# Patient Record
Sex: Female | Born: 1990 | Race: Black or African American | Hispanic: No | Marital: Single | Smoking: Never smoker
Health system: Southern US, Community
[De-identification: ages and names within clinical notes are randomized; demographics above are authoritative.]

## PROBLEM LIST (undated history)

## (undated) ENCOUNTER — Inpatient Hospital Stay (HOSPITAL_COMMUNITY): Payer: Self-pay

## (undated) DIAGNOSIS — E119 Type 2 diabetes mellitus without complications: Secondary | ICD-10-CM

## (undated) DIAGNOSIS — Z789 Other specified health status: Secondary | ICD-10-CM

## (undated) HISTORY — PX: NO PAST SURGERIES: SHX2092

## (undated) HISTORY — PX: DILATION AND CURETTAGE OF UTERUS: SHX78

---
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
## 2014-01-17 ENCOUNTER — Ambulatory Visit: Payer: Self-pay | Admitting: Family Medicine

## 2015-02-06 ENCOUNTER — Encounter (HOSPITAL_COMMUNITY): Payer: Self-pay | Admitting: Emergency Medicine

## 2015-02-06 ENCOUNTER — Emergency Department (HOSPITAL_COMMUNITY)
Admission: EM | Admit: 2015-02-06 | Discharge: 2015-02-06 | Discharge status: Against Medical Advice | Payer: Self-pay | Attending: Emergency Medicine | Admitting: Emergency Medicine

## 2015-02-06 DIAGNOSIS — Z3201 Encounter for pregnancy test, result positive: Secondary | ICD-10-CM | POA: Insufficient documentation

## 2015-02-06 LAB — I-STAT BETA HCG BLOOD, ED (MC, WL, AP ONLY): HCG, QUANTITATIVE: 261.4 m[IU]/mL — AB (ref <5)

## 2015-02-06 NOTE — ED Notes (Signed)
Pt from home states her last menstrual cycle was 01/10/15- 01/12/15. Pt states she took two home pregnancy tests and both came up positive. She has had light spotting today and cramps. She wants to verify that she is pregnant.

## 2015-02-06 NOTE — ED Notes (Signed)
Pt did not answer when called from lobby.  

## 2015-02-07 NOTE — ED Notes (Signed)
Pt did not answer when called from lobby.  

## 2015-04-05 ENCOUNTER — Emergency Department (HOSPITAL_COMMUNITY): Payer: Medicaid Other

## 2015-04-05 ENCOUNTER — Encounter (HOSPITAL_COMMUNITY): Payer: Self-pay | Admitting: *Deleted

## 2015-04-05 ENCOUNTER — Emergency Department (HOSPITAL_COMMUNITY)
Admission: EM | Admit: 2015-04-05 | Discharge: 2015-04-05 | Disposition: A | Discharge status: Home / Self Care | Payer: Medicaid Other | Attending: Emergency Medicine | Admitting: Emergency Medicine

## 2015-04-05 DIAGNOSIS — O4690 Antepartum hemorrhage, unspecified, unspecified trimester: Secondary | ICD-10-CM

## 2015-04-05 DIAGNOSIS — O418X20 Other specified disorders of amniotic fluid and membranes, second trimester, not applicable or unspecified: Secondary | ICD-10-CM

## 2015-04-05 DIAGNOSIS — Z79899 Other long term (current) drug therapy: Secondary | ICD-10-CM | POA: Diagnosis not present

## 2015-04-05 DIAGNOSIS — Z3A13 13 weeks gestation of pregnancy: Secondary | ICD-10-CM | POA: Insufficient documentation

## 2015-04-05 DIAGNOSIS — O209 Hemorrhage in early pregnancy, unspecified: Secondary | ICD-10-CM | POA: Diagnosis present

## 2015-04-05 DIAGNOSIS — O468X2 Other antepartum hemorrhage, second trimester: Secondary | ICD-10-CM

## 2015-04-05 LAB — BASIC METABOLIC PANEL
ANION GAP: 8 (ref 5–15)
BUN: 6 mg/dL (ref 6–20)
CHLORIDE: 107 mmol/L (ref 101–111)
CO2: 22 mmol/L (ref 22–32)
CREATININE: 0.68 mg/dL (ref 0.44–1.00)
Calcium: 9.2 mg/dL (ref 8.9–10.3)
GFR calc non Af Amer: >60 mL/min (ref >60)
Glucose, Bld: 90 mg/dL (ref 65–99)
Potassium: 3.9 mmol/L (ref 3.5–5.1)
SODIUM: 137 mmol/L (ref 135–145)

## 2015-04-05 LAB — URINALYSIS, ROUTINE W REFLEX MICROSCOPIC
Bilirubin Urine: NEGATIVE
Glucose, UA: NEGATIVE mg/dL
Ketones, ur: NEGATIVE mg/dL
NITRITE: NEGATIVE
Protein, ur: NEGATIVE mg/dL
Specific Gravity, Urine: 1.026 (ref 1.005–1.030)
UROBILINOGEN UA: 1 mg/dL (ref 0.0–1.0)
pH: 7 (ref 5.0–8.0)

## 2015-04-05 LAB — CBC WITH DIFFERENTIAL/PLATELET
BASOS ABS: 0 10*3/uL (ref 0.0–0.1)
BASOS PCT: 0 % (ref 0–1)
EOS ABS: 0.1 10*3/uL (ref 0.0–0.7)
Eosinophils Relative: 1 % (ref 0–5)
HCT: 39 % (ref 36.0–46.0)
HEMOGLOBIN: 12.8 g/dL (ref 12.0–15.0)
Lymphocytes Relative: 36 % (ref 12–46)
Lymphs Abs: 2.1 10*3/uL (ref 0.7–4.0)
MCH: 26.1 pg (ref 26.0–34.0)
MCHC: 32.8 g/dL (ref 30.0–36.0)
MCV: 79.6 fL (ref 78.0–100.0)
Monocytes Absolute: 0.8 10*3/uL (ref 0.1–1.0)
Monocytes Relative: 14 % — ABNORMAL HIGH (ref 3–12)
Neutro Abs: 2.9 10*3/uL (ref 1.7–7.7)
Neutrophils Relative %: 49 % (ref 43–77)
Platelets: 148 10*3/uL — ABNORMAL LOW (ref 150–400)
RBC: 4.9 MIL/uL (ref 3.87–5.11)
RDW: 13.9 % (ref 11.5–15.5)
WBC: 5.9 10*3/uL (ref 4.0–10.5)

## 2015-04-05 LAB — ABO/RH: ABO/RH(D): B POS

## 2015-04-05 LAB — URINE MICROSCOPIC-ADD ON

## 2015-04-05 NOTE — Discharge Instructions (Signed)
As discussed you need to not have sex and you need to call your doctor to be seen earlier then Friday. If you start to bleed more then one pad per hour you need to be re seen at [facility] Vaginal Bleeding During Pregnancy, Second Trimester  A small amount of bleeding (spotting) from the vagina is common in pregnancy. Sometimes the bleeding is normal and is not a problem, and sometimes it is a sign of something serious. Be sure to tell your doctor about any bleeding from your vagina right away. HOME CARE  Watch your condition for any changes.  Follow your doctor's instructions about how active you can be.  If you are on bed rest:  You may need to stay in bed and only get up to use the bathroom.  You may be allowed to do some activities.  If you need help, make plans for someone to help you.  Write down:  The number of pads you use each day.  How often you change pads.  How soaked (saturated) your pads are.  Do not use tampons.  Do not douche.  Do not have sex or orgasms until your doctor says it is okay.  If you pass any tissue from your vagina, save the tissue so you can show it to your doctor.  Only take medicines as told by your doctor.  Do not take aspirin because it can make you bleed.  Do not exercise, lift heavy weights, or do any activities that take a lot of energy and effort unless your doctor says it is okay.  Keep all follow-up visits as told by your doctor. GET HELP IF:   You bleed from your vagina.  You have cramps.  You have labor pains.  You have a fever that does not go away after you take medicine. GET HELP RIGHT AWAY IF:  You have very bad cramps in your back or belly (abdomen).  You have contractions.  You have chills.  You pass large clots or tissue from your vagina.  You bleed more.  You feel light-headed or weak.  You pass out (faint).  You are leaking fluid or have a gush of fluid from your vagina. MAKE SURE YOU:  Understand  these instructions.  Will watch your condition.  Will get help right away if you are not doing well or get worse. Document Released: 12/16/2013 Document Reviewed: 04/08/2013 [facility] Patient Information 2015 [place], [place]. This information is not intended to replace advice given to you by your health care provider. Make sure you discuss any questions you have with your health care provider.  Subchorionic Hematoma A subchorionic hematoma is a gathering of blood between the outer wall of the placenta and the inner wall of the womb (uterus). The placenta is the organ that connects the fetus to the wall of the uterus. The placenta performs the feeding, breathing (oxygen to the fetus), and waste removal (excretory work) of the fetus.  Subchorionic hematoma is the most common abnormality found on a result from ultrasonography done during the first trimester or early second trimester of pregnancy. If there has been little or no vaginal bleeding, early small hematomas usually shrink on their own and do not affect your baby or pregnancy. The blood is gradually absorbed over 1-2 weeks. When bleeding starts later in pregnancy or the hematoma is larger or occurs in an older pregnant woman, the outcome may not be as good. Larger hematomas may get bigger, which increases the chances for miscarriage.  Subchorionic hematoma also increases the risk of premature detachment of the placenta from the uterus, preterm (premature) labor, and stillbirth. HOME CARE INSTRUCTIONS  Stay on bed rest if your health care provider recommends this. Although bed rest will not prevent more bleeding or prevent a miscarriage, your health care provider may recommend bed rest until you are advised otherwise.  Avoid heavy lifting (more than 10 lb [4.5 kg]), exercise, sexual intercourse, or douching as directed by your health care provider.  Keep track of the number of pads you use each day and how soaked (saturated) they are. Write down  this information.  Do not use tampons.  Keep all follow-up appointments as directed by your health care provider. Your health care provider may ask you to have follow-up blood tests or ultrasound tests or both. SEEK IMMEDIATE MEDICAL CARE IF:  You have severe cramps in your stomach, back, abdomen, or pelvis.  You have a fever.  You pass large clots or tissue. Save any tissue for your health care provider to look at.  Your bleeding increases or you become lightheaded, feel weak, or have fainting episodes. Document Released: 11/16/2006 Document Revised: 12/16/2013 Document Reviewed: 02/28/2013 [facility] Patient Information 2015 [place], [place]. This information is not intended to replace advice given to you by your health care provider. Make sure you discuss any questions you have with your health care provider.

## 2015-04-05 NOTE — ED Provider Notes (Signed)
CSN: [id number]     Arrival date & time 04/05/15  1742 History   First MD Initiated Contact with Patient 04/05/15 1821     Chief Complaint  Patient presents with  . Vaginal Bleeding     (Consider location/radiation/quality/duration/timing/severity/associated sxs/prior Treatment) HPI Comments: Pt comes in with c/o vaginal bleeding for about 1 hour. Pt states that she has also had onset of lower abdominal pain. She states that she is [redacted] weeks pregnant and she has had an ultrasound by her ob to show that baby is intrauterine. She has had one other pregnancy without complication. She has not had any dysuria, vomiting,fever or diarrhea.  The history is provided by the patient. No language interpreter was used.    History reviewed. No pertinent past medical history. History reviewed. No pertinent past surgical history. No family history on file. Social History  Substance Use Topics  . Smoking status: Never Smoker   . Smokeless tobacco: None  . Alcohol Use: Yes     Comment: socially   OB History    Gravida Para Term Preterm AB TAB SAB Ectopic Multiple Living            1     Review of Systems  All other systems reviewed and are negative.     Allergies  Review of patient's allergies indicates no known allergies.  Home Medications   Prior to Admission medications   Medication Sig Start Date End Date Taking? Authorizing Provider  Prenatal Vit-Fe Fumarate-FA (PRENATAL PO) Take 1 tablet by mouth daily.   Yes Historical Provider, MD   BP 118/62 mmHg  Pulse 98  Temp(Src) 98.5 F (36.9 C) (Oral)  Resp 16  SpO2 99%  LMP 01/08/2015 Physical Exam  Constitutional: She is oriented to person, place, and time. She appears well-developed and well-nourished.  HENT:  Head: Normocephalic and atraumatic.  Cardiovascular: Normal rate and regular rhythm.   Pulmonary/Chest: Effort normal and breath sounds normal.  Abdominal: Soft. Bowel sounds are normal.  Generalized lower abdominal  tenderness  Genitourinary:  Small amount of blood in the vaginal vault  Musculoskeletal: Normal range of motion.  Neurological: She is alert and oriented to person, place, and time.  Skin: Skin is warm and dry.  Psychiatric: She has a normal mood and affect.  Nursing note and vitals reviewed.   ED Course  Procedures (including critical care time) Labs Review Labs Reviewed  CBC WITH DIFFERENTIAL/PLATELET - Abnormal; Notable for the following:    Platelets 148 (*)    Monocytes Relative 14 (*)    All other components within normal limits  URINALYSIS, ROUTINE W REFLEX MICROSCOPIC (NOT AT [facility]) - Abnormal; Notable for the following:    APPearance CLOUDY (*)    Hgb urine dipstick LARGE (*)    Leukocytes, UA MODERATE (*)    All other components within normal limits  URINE MICROSCOPIC-ADD ON - Abnormal; Notable for the following:    Squamous Epithelial / LPF MANY (*)    Bacteria, UA MANY (*)    All other components within normal limits  URINE CULTURE  BASIC METABOLIC PANEL  ABO/RH    Imaging Review US Ob Comp Less 14 Wks  04/05/2015   CLINICAL DATA:  Vaginal bleeding. Estimated gestational age by LMP is 12 weeks 3 days. Quantitative beta HCG is not provided.  EXAM: OBSTETRIC <14 WK ULTRASOUND  TECHNIQUE: Transabdominal ultrasound was performed for evaluation of the gestation as well as the maternal uterus and adnexal regions.  COMPARISON:  02/24/2015  FINDINGS: Intrauterine gestational sac: A single intrauterine pregnancy is identified.  Yolk sac: Yolk sac is not identified consistent with gestational age.  Embryo:  Fetal pole is present.  Cardiac Activity: Fetal cardiac activity is observed.  Heart Rate: 150 bpm  Limited measurements obtained.  Biparietal diameter: 20.5 mm 13 w 2 d [place] EDC: 10/09/2015  Measurements are consistent with previous ultrasound and LMP dates.  Maternal uterus/adnexae: No myometrial mass lesions. Placenta is anterior and is not grossly low-lying. Small subchorionic  hemorrhage is visualized superiorly. Both ovaries are visualized and are unremarkable. No abnormal adnexal masses. No free pelvic fluid.  IMPRESSION: Single intrauterine pregnancy. Estimated gestational age by limited measurement of biparietal diameter is 13 weeks 2 days. Size is consistent with previous study and LMP. Small subchorionic hemorrhage.   Electronically Signed   By: Burman Nieves M.D.   On: 04/05/2015 21:17   I have personally reviewed and evaluated these images and lab results as part of my medical decision-making.   EKG Interpretation None      MDM   Final diagnoses:  Subchorionic hemorrhage, second trimester    Discussed findings with pt. She [place] an ob [place] that she can follow up with. She is comfortable at this time;pt given return precautions    Teressa Lower, NP 04/05/15 2222  Benjiman Core, MD 04/06/15 [phone]

## 2015-04-05 NOTE — ED Notes (Signed)
Patient unable to urinate at this time. 

## 2015-04-05 NOTE — ED Notes (Signed)
Pt reports hematuria x 30 minutes ago.  Pt is [redacted] weeks pregnant.  Pt also reports low abd cramping as well.

## 2015-04-06 ENCOUNTER — Encounter (HOSPITAL_COMMUNITY): Payer: Self-pay | Admitting: *Deleted

## 2015-04-06 ENCOUNTER — Inpatient Hospital Stay (HOSPITAL_COMMUNITY)
Admission: AD | Admit: 2015-04-06 | Discharge: 2015-04-06 | Disposition: A | Discharge status: Home / Self Care | Payer: Medicaid Other | Source: Ambulatory Visit | Attending: Family Medicine | Admitting: Family Medicine

## 2015-04-06 DIAGNOSIS — N939 Abnormal uterine and vaginal bleeding, unspecified: Secondary | ICD-10-CM | POA: Insufficient documentation

## 2015-04-06 DIAGNOSIS — O209 Hemorrhage in early pregnancy, unspecified: Secondary | ICD-10-CM | POA: Diagnosis present

## 2015-04-06 DIAGNOSIS — O418X10 Other specified disorders of amniotic fluid and membranes, first trimester, not applicable or unspecified: Secondary | ICD-10-CM

## 2015-04-06 DIAGNOSIS — O4691 Antepartum hemorrhage, unspecified, first trimester: Secondary | ICD-10-CM | POA: Diagnosis not present

## 2015-04-06 DIAGNOSIS — Z3A12 12 weeks gestation of pregnancy: Secondary | ICD-10-CM | POA: Diagnosis not present

## 2015-04-06 DIAGNOSIS — O468X1 Other antepartum hemorrhage, first trimester: Secondary | ICD-10-CM

## 2015-04-06 HISTORY — DX: Other specified health status: Z78.9

## 2015-04-06 LAB — URINALYSIS, ROUTINE W REFLEX MICROSCOPIC
BILIRUBIN URINE: NEGATIVE
Glucose, UA: NEGATIVE mg/dL
Ketones, ur: 15 mg/dL — AB
NITRITE: NEGATIVE
Protein, ur: NEGATIVE mg/dL
SPECIFIC GRAVITY, URINE: 1.02 (ref 1.005–1.030)
UROBILINOGEN UA: 1 mg/dL (ref 0.0–1.0)
pH: 7 (ref 5.0–8.0)

## 2015-04-06 LAB — URINE MICROSCOPIC-ADD ON

## 2015-04-06 NOTE — MAU Note (Signed)
Pt. States she had lower abdominal pain yesterday at the pool with her son. Then noticed bright red bleeding and small clots. Went to [facility] and they did an ultrasound and told her she had a subchorionic hemorrhage. Pt. States bleeding stopped and this am walked her son to the bus and felt that she might have peed her pants but once she got home she saw that it was a large amount of dark blood. Here for evaluation and per pt. A better explaination about what is happening. FHR 156.

## 2015-04-06 NOTE — Discharge Instructions (Signed)
Subchorionic Hematoma °A subchorionic hematoma is a gathering of blood between the outer wall of the placenta and the inner wall of the womb (uterus). The placenta is the organ that connects the fetus to the wall of the uterus. The placenta performs the feeding, breathing (oxygen to the fetus), and waste removal (excretory work) of the fetus.  °Subchorionic hematoma is the most common abnormality found on a result from ultrasonography done during the first trimester or early second trimester of pregnancy. If there has been little or no vaginal bleeding, early small hematomas usually shrink on their own and do not affect your baby or pregnancy. The blood is gradually absorbed over 1-2 weeks. When bleeding starts later in pregnancy or the hematoma is larger or occurs in an older pregnant woman, the outcome may not be as good. Larger hematomas may get bigger, which increases the chances for miscarriage. Subchorionic hematoma also increases the risk of premature detachment of the placenta from the uterus, preterm (premature) labor, and stillbirth. °HOME CARE INSTRUCTIONS °· Stay on bed rest if your health care provider recommends this. Although bed rest will not prevent more bleeding or prevent a miscarriage, your health care provider may recommend bed rest until you are advised otherwise. °· Avoid heavy lifting (more than 10 lb [4.5 kg]), exercise, sexual intercourse, or douching as directed by your health care provider. °· Keep track of the number of pads you use each day and how soaked (saturated) they are. Write down this information. °· Do not use tampons. °· Keep all follow-up appointments as directed by your health care provider. Your health care provider may ask you to have follow-up blood tests or ultrasound tests or both. °SEEK IMMEDIATE MEDICAL CARE IF: °· You have severe cramps in your stomach, back, abdomen, or pelvis. °· You have a fever. °· You pass large clots or tissue. Save any tissue for your health  care provider to look at. °· Your bleeding increases or you become lightheaded, feel weak, or have fainting episodes. °Document Released: 11/16/2006 Document Revised: 12/16/2013 Document Reviewed: 02/28/2013 °ExitCare® Patient Information ©2015 [employer]. This information is not intended to replace advice given to you by your health care provider. Make sure you discuss any questions you have with your health care provider. ° °Pelvic Rest °Pelvic rest is sometimes recommended for women when:  °· The placenta is partially or completely covering the opening of the cervix (placenta previa). °· There is bleeding between the uterine wall and the amniotic sac in the first trimester (subchorionic hemorrhage). °· The cervix begins to open without labor starting (incompetent cervix, cervical insufficiency). °· The labor is too early (preterm labor). °HOME CARE INSTRUCTIONS °· Do not have sexual intercourse, stimulation, or an orgasm. °· Do not use tampons, douche, or put anything in the vagina. °· Do not lift anything over 10 pounds (4.5 kg). °· Avoid strenuous activity or straining your pelvic muscles. °SEEK MEDICAL CARE IF:  °· You have any vaginal bleeding during pregnancy. Treat this as a potential emergency. °· You have cramping pain felt low in the stomach (stronger than menstrual cramps). °· You notice vaginal discharge (watery, mucus, or bloody). °· You have a low, dull backache. °· There are regular contractions or uterine tightening. °SEEK IMMEDIATE MEDICAL CARE IF: °You have vaginal bleeding and have placenta previa.  °Document Released: 11/26/2010 Document Revised: 10/24/2011 Document Reviewed: 11/26/2010 °ExitCare® Patient Information ©2015 [employer]. This information is not intended to replace advice given to you by your health care provider.   Make sure you discuss any questions you have with your health care provider.     Prenatal Care [facility]    [facility]  [facility]  [facility]  [phone]     Phone: [phone]          [facility]                      [facility]   [place]     Phone: [phone]  Phone: [phone]         [facility]     Phone: [phone]  Phone: [phone]           [facility] [place]                hone: [phone]  Phone: [phone]         [facility] [address] Francoise Ceo      Phone: [phone]  Phone: [phone]         [facility] Dept.                Phone: [phone]  [facility]   [address] [place])          Phone: [phone] [facility]   Phone: [phone]

## 2015-04-06 NOTE — Plan of Care (Signed)
Pt. Urine in lab 

## 2015-04-06 NOTE — MAU Provider Note (Signed)
History     CSN: [id number]  Arrival date and time: 04/06/15 1101   First Provider Initiated Contact with Patient 04/06/15 1217      No chief complaint on file.  HPI   Ms.Olivia Mccall is a 24 y.o. female G2P0 at [redacted]w[redacted]d presenting with vaginal bleeding; patient has a known small subchorionic hemorrhage that was diagnosed at [facility] yesterday.  She walked her son to the bus stop this morning and had another episode of bleeding. The bleeding is the same as it was yesterday; dark red in color, brown at times. She has occasional abdominal cramping; she has not taken anything for the symptoms.   OB History    Gravida Para Term Preterm AB TAB SAB Ectopic Multiple Living   2         1      Past Medical History  Diagnosis Date  . Medical history non-contributory     Past Surgical History  Procedure Laterality Date  . No past surgeries      History reviewed. No pertinent family history.  Social History  Substance Use Topics  . Smoking status: Never Smoker   . Smokeless tobacco: None  . Alcohol Use: No     Comment: socially    Allergies: No Known Allergies  Prescriptions prior to admission  Medication Sig Dispense Refill Last Dose  . Prenatal Vit-Fe Fumarate-FA (PRENATAL PO) Take 1 tablet by mouth daily.   04/06/2015 at Unknown time   Results for orders placed or performed during the hospital encounter of 04/06/15 (from the past 48 hour(s))  Urinalysis, Routine w reflex microscopic (not at [facility])     Status: Abnormal   Collection Time: 04/06/15 11:40 AM  Result Value Ref Range   Color, Urine YELLOW YELLOW   APPearance HAZY (A) CLEAR   Specific Gravity, Urine 1.020 1.005 - 1.030   pH 7.0 5.0 - 8.0   Glucose, UA NEGATIVE NEGATIVE mg/dL   Hgb urine dipstick LARGE (A) NEGATIVE   Bilirubin Urine NEGATIVE NEGATIVE   Ketones, ur 15 (A) NEGATIVE mg/dL   Protein, ur NEGATIVE NEGATIVE mg/dL   Urobilinogen, UA 1.0 0.0 - 1.0 mg/dL   Nitrite NEGATIVE NEGATIVE   Leukocytes, UA SMALL (A) NEGATIVE  Urine microscopic-add on     Status: Abnormal   Collection Time: 04/06/15 11:40 AM  Result Value Ref Range   Squamous Epithelial / LPF MANY (A) RARE   WBC, UA 3-6 <3 WBC/hpf   Bacteria, UA FEW (A) RARE   Urine-Other MUCOUS PRESENT      Review of Systems  Constitutional: Negative for fever and chills.  Gastrointestinal: Positive for nausea and abdominal pain (occasional, bilateral lower abdominal cramping ). Negative for diarrhea and constipation.  Genitourinary: Negative for dysuria, urgency, frequency and hematuria.   Physical Exam   Blood pressure 123/53, pulse 97, temperature 98.3 F (36.8 C), temperature source Oral, resp. rate 18, last menstrual period 01/08/2015.  Physical Exam  Constitutional: She is oriented to person, place, and time. She appears well-developed and well-nourished. No distress.  HENT:  Head: Normocephalic.  Eyes: Pupils are equal, round, and reactive to light.  Neck: Neck supple.  Cardiovascular: Normal rate.   Respiratory: Effort normal.  GI: Soft.  Genitourinary:  Speculum exam: Vagina - Small amount of creamy, brown discharge, no odor Cervix - No contact bleeding, no active bleeding  Bimanual exam: Cervix closed Chaperone present for exam.  Musculoskeletal: Normal range of motion.  Neurological: She is alert and oriented  to person, place, and time.  Skin: Skin is warm. She is not diaphoretic.  Psychiatric: Her behavior is normal.    MAU Course  Procedures  None  MDM   + fetal heart tones via doppler.   Assessment and Plan   A:  1. Vaginal bleeding in pregnancy, first trimester   2. Subchorionic hematoma in first trimester     P:  Discharge home in stable condition Increase PO fluid intake Return to MAU if symptoms worsen Bleeding precautions   Duane Lope, NP 04/06/2015 8:07 PM

## 2015-04-08 LAB — URINE CULTURE

## 2016-02-09 ENCOUNTER — Encounter (HOSPITAL_COMMUNITY): Payer: Self-pay | Admitting: *Deleted

## 2018-09-27 ENCOUNTER — Encounter (HOSPITAL_BASED_OUTPATIENT_CLINIC_OR_DEPARTMENT_OTHER): Payer: Self-pay | Admitting: *Deleted

## 2018-09-27 ENCOUNTER — Emergency Department (HOSPITAL_BASED_OUTPATIENT_CLINIC_OR_DEPARTMENT_OTHER): Payer: 59

## 2018-09-27 ENCOUNTER — Other Ambulatory Visit: Payer: Self-pay

## 2018-09-27 ENCOUNTER — Inpatient Hospital Stay (HOSPITAL_BASED_OUTPATIENT_CLINIC_OR_DEPARTMENT_OTHER)
Admission: EM | Admit: 2018-09-27 | Discharge: 2018-10-02 | DRG: 193 | Disposition: A | Discharge status: Home / Self Care | Payer: 59 | Attending: Internal Medicine | Admitting: Internal Medicine

## 2018-09-27 DIAGNOSIS — T886XXA Anaphylactic reaction due to adverse effect of correct drug or medicament properly administered, initial encounter: Secondary | ICD-10-CM | POA: Diagnosis not present

## 2018-09-27 DIAGNOSIS — X58XXXA Exposure to other specified factors, initial encounter: Secondary | ICD-10-CM | POA: Diagnosis present

## 2018-09-27 DIAGNOSIS — J180 Bronchopneumonia, unspecified organism: Secondary | ICD-10-CM | POA: Diagnosis not present

## 2018-09-27 DIAGNOSIS — J986 Disorders of diaphragm: Secondary | ICD-10-CM

## 2018-09-27 DIAGNOSIS — J9811 Atelectasis: Secondary | ICD-10-CM | POA: Diagnosis present

## 2018-09-27 DIAGNOSIS — R0600 Dyspnea, unspecified: Secondary | ICD-10-CM

## 2018-09-27 DIAGNOSIS — J45909 Unspecified asthma, uncomplicated: Secondary | ICD-10-CM | POA: Diagnosis not present

## 2018-09-27 DIAGNOSIS — G92 Toxic encephalopathy: Secondary | ICD-10-CM | POA: Diagnosis present

## 2018-09-27 DIAGNOSIS — Z8709 Personal history of other diseases of the respiratory system: Secondary | ICD-10-CM | POA: Insufficient documentation

## 2018-09-27 DIAGNOSIS — R0682 Tachypnea, not elsewhere classified: Secondary | ICD-10-CM | POA: Diagnosis present

## 2018-09-27 DIAGNOSIS — J383 Other diseases of vocal cords: Secondary | ICD-10-CM | POA: Diagnosis present

## 2018-09-27 DIAGNOSIS — R161 Splenomegaly, not elsewhere classified: Secondary | ICD-10-CM | POA: Diagnosis not present

## 2018-09-27 DIAGNOSIS — J9601 Acute respiratory failure with hypoxia: Secondary | ICD-10-CM | POA: Diagnosis present

## 2018-09-27 DIAGNOSIS — R946 Abnormal results of thyroid function studies: Secondary | ICD-10-CM | POA: Diagnosis not present

## 2018-09-27 DIAGNOSIS — R000 Tachycardia, unspecified: Secondary | ICD-10-CM | POA: Diagnosis not present

## 2018-09-27 DIAGNOSIS — T368X5A Adverse effect of other systemic antibiotics, initial encounter: Secondary | ICD-10-CM | POA: Diagnosis not present

## 2018-09-27 DIAGNOSIS — R059 Cough, unspecified: Secondary | ICD-10-CM

## 2018-09-27 DIAGNOSIS — Z6841 Body Mass Index (BMI) 40.0 and over, adult: Secondary | ICD-10-CM

## 2018-09-27 DIAGNOSIS — R05 Cough: Secondary | ICD-10-CM

## 2018-09-27 DIAGNOSIS — R0789 Other chest pain: Secondary | ICD-10-CM

## 2018-09-27 LAB — CBC WITH DIFFERENTIAL/PLATELET
Abs Immature Granulocytes: 0.01 10*3/uL (ref 0.00–0.07)
Basophils Absolute: 0.1 10*3/uL (ref 0.0–0.1)
Basophils Relative: 1 %
Eosinophils Absolute: 0 10*3/uL (ref 0.0–0.5)
Eosinophils Relative: 0 %
HCT: 42.6 % (ref 36.0–46.0)
Hemoglobin: 12.9 g/dL (ref 12.0–15.0)
IMMATURE GRANULOCYTES: 0 %
Lymphocytes Relative: 67 %
Lymphs Abs: 4.8 10*3/uL — ABNORMAL HIGH (ref 0.7–4.0)
MCH: 24.1 pg — AB (ref 26.0–34.0)
MCHC: 30.3 g/dL (ref 30.0–36.0)
MCV: 79.6 fL — ABNORMAL LOW (ref 80.0–100.0)
Monocytes Absolute: 0.5 10*3/uL (ref 0.1–1.0)
Monocytes Relative: 7 %
NEUTROS ABS: 1.8 10*3/uL (ref 1.7–7.7)
NEUTROS PCT: 25 %
PLATELETS: 166 10*3/uL (ref 150–400)
RBC: 5.35 MIL/uL — ABNORMAL HIGH (ref 3.87–5.11)
RDW: 14.8 % (ref 11.5–15.5)
WBC MORPHOLOGY: ABNORMAL
WBC: 7.2 10*3/uL (ref 4.0–10.5)
nRBC: 0 % (ref 0.0–0.2)

## 2018-09-27 LAB — RESPIRATORY PANEL BY PCR
Adenovirus: NOT DETECTED
Bordetella pertussis: NOT DETECTED
CHLAMYDOPHILA PNEUMONIAE-RVPPCR: NOT DETECTED
Coronavirus 229E: NOT DETECTED
Coronavirus HKU1: NOT DETECTED
Coronavirus NL63: NOT DETECTED
Coronavirus OC43: NOT DETECTED
INFLUENZA B-RVPPCR: NOT DETECTED
Influenza A: NOT DETECTED
Metapneumovirus: NOT DETECTED
Mycoplasma pneumoniae: NOT DETECTED
Parainfluenza Virus 1: NOT DETECTED
Parainfluenza Virus 2: NOT DETECTED
Parainfluenza Virus 3: NOT DETECTED
Parainfluenza Virus 4: NOT DETECTED
Respiratory Syncytial Virus: NOT DETECTED
Rhinovirus / Enterovirus: NOT DETECTED

## 2018-09-27 LAB — INFLUENZA PANEL BY PCR (TYPE A & B)
INFLAPCR: NEGATIVE
Influenza B By PCR: NEGATIVE

## 2018-09-27 LAB — BASIC METABOLIC PANEL
ANION GAP: 7 (ref 5–15)
BUN: 7 mg/dL (ref 6–20)
CALCIUM: 9.1 mg/dL (ref 8.9–10.3)
CO2: 26 mmol/L (ref 22–32)
Chloride: 102 mmol/L (ref 98–111)
Creatinine, Ser: 1.02 mg/dL — ABNORMAL HIGH (ref 0.44–1.00)
GFR calc Af Amer: >60 mL/min (ref >60)
GLUCOSE: 122 mg/dL — AB (ref 70–99)
Potassium: 4.1 mmol/L (ref 3.5–5.1)
Sodium: 135 mmol/L (ref 135–145)

## 2018-09-27 MED ORDER — SODIUM CHLORIDE 0.9 % IV BOLUS
1000.0000 mL | Freq: Once | INTRAVENOUS | Status: AC
  Administered 2018-09-27: 1000 mL via INTRAVENOUS

## 2018-09-27 MED ORDER — IOPAMIDOL (ISOVUE-370) INJECTION 76%
100.0000 mL | Freq: Once | INTRAVENOUS | Status: AC | PRN
  Administered 2018-09-27: 100 mL via INTRAVENOUS

## 2018-09-27 MED ORDER — ACETAMINOPHEN 500 MG PO TABS
1000.0000 mg | ORAL_TABLET | Freq: Once | ORAL | Status: AC
  Administered 2018-09-27: 1000 mg via ORAL
  Filled 2018-09-27: qty 2

## 2018-09-27 MED ORDER — ALBUTEROL SULFATE (2.5 MG/3ML) 0.083% IN NEBU
2.5000 mg | INHALATION_SOLUTION | Freq: Once | RESPIRATORY_TRACT | Status: AC
  Administered 2018-09-27: 2.5 mg via RESPIRATORY_TRACT
  Filled 2018-09-27: qty 3

## 2018-09-27 MED ORDER — IPRATROPIUM-ALBUTEROL 0.5-2.5 (3) MG/3ML IN SOLN
3.0000 mL | Freq: Four times a day (QID) | RESPIRATORY_TRACT | Status: DC
  Administered 2018-09-27: 3 mL via RESPIRATORY_TRACT
  Filled 2018-09-27: qty 3

## 2018-09-27 MED ORDER — SODIUM CHLORIDE 0.9 % IV SOLN: Freq: Once | INTRAVENOUS | Status: DC

## 2018-09-27 NOTE — ED Notes (Signed)
Nurse first-pt NAD-nonprod cough-O2 sat 97%- HR 118- RR 20- NAD

## 2018-09-27 NOTE — ED Notes (Signed)
Attempted report to floor.  

## 2018-09-27 NOTE — ED Triage Notes (Signed)
Pt c/o Uri symptoms x 3 days

## 2018-09-27 NOTE — ED Notes (Signed)
[facility] notified (Jaime) - patient ready for transport 

## 2018-09-27 NOTE — ED Notes (Signed)
Pt transferred to [place] via [facility] 

## 2018-09-27 NOTE — ED Notes (Signed)
Oxygen saturation 97-99 while ambulating.  HR 140s

## 2018-09-27 NOTE — ED Provider Notes (Signed)
[facility] EMERGENCY DEPARTMENT Provider Note   CSN: [id number] Arrival date & time: 09/27/18  1339     History   Chief Complaint Chief Complaint  Patient presents with  . URI    HPI Olivia Mccall is a 28 y.o. female presenting for evaluation of cough, chest pain, shortness of breath.  Patient states for the past 2 days, she has been having a dry, constant cough.  Today, she developed generalized chest pain.  She states she feels short of breath, especially when she walks.  She denies fevers, chills, nasal congestion, ear pain, sore throat, nausea, vomiting, dental pain, urinary symptoms, normal bowel movements.  She denies leg pain or swelling.  She denies recent travel, surgeries, immobilization, history of cancer, or history of previous DVT/PE.  She is on OCPs.  She denies history of heart problems or family history of heart problems.  She has tobacco, alcohol, or drug use.  She has no medical problems, takes medications daily.  She is not been taking anything for her symptoms.  She denies history of asthma or COPD.  She denies sick contacts.  HPI  Past Medical History:  Diagnosis Date  . Medical history non-contributory     Patient Active Problem List   Diagnosis Date Noted  . Acute respiratory failure with hypoxia (HCC) 09/27/2018    Past Surgical History:  Procedure Laterality Date  . NO PAST SURGERIES       OB History    Gravida  2   Para      Term      Preterm      AB      Living  1     SAB      TAB      Ectopic      Multiple      Live Births               Home Medications    Prior to Admission medications   Not on File    Family History History reviewed. No pertinent family history.  Social History Social History   Tobacco Use  . Smoking status: Never Smoker  . Smokeless tobacco: Never Used  Substance Use Topics  . Alcohol use: No    Comment: socially  . Drug use: No     Allergies   Patient has no known  allergies.   Review of Systems Review of Systems  Respiratory: Positive for cough and shortness of breath.   Cardiovascular: Positive for chest pain.  All other systems reviewed and are negative.    Physical Exam Updated Vital Signs BP 121/85 (BP Location: Left Arm)   Pulse (!) 115   Temp 98.1 F (36.7 C) (Oral)   Resp (!) 28   Ht 5\' 2"  (1.575 m)   Wt 107.6 kg   LMP 07/31/2018   SpO2 97%   BMI 43.39 kg/m   Physical Exam Vitals signs and nursing note reviewed.  Constitutional:      General: She is not in acute distress.    Appearance: She is well-developed.  HENT:     Head: Normocephalic and atraumatic.     Comments: OP clear without tonsillar swelling or exudate.  Uvula midline with equal palate rise.  TMs nonerythematous and nonbulging bilaterally. Eyes:     Conjunctiva/sclera: Conjunctivae normal.     Pupils: Pupils are equal, round, and reactive to light.  Neck:     Musculoskeletal: Normal range of motion and neck supple.  Cardiovascular:     Rate and Rhythm: Regular rhythm. Tachycardia present.     Comments: Tachycardic around 125 Pulmonary:     Effort: Pulmonary effort is normal. Tachypnea present. No respiratory distress.     Breath sounds: Normal breath sounds. No wheezing.     Comments: Patient tachypneic around 25-30 on my exam.  Speaking in full sentences.  Clear lung sounds in all fields.  Dry cough noted on exam. Abdominal:     General: There is no distension.     Palpations: Abdomen is soft. There is no mass.     Tenderness: There is no abdominal tenderness. There is no guarding or rebound.  Musculoskeletal: Normal range of motion.  Skin:    General: Skin is warm and dry.     Capillary Refill: Capillary refill takes less than 2 seconds.  Neurological:     Mental Status: She is alert and oriented to person, place, and time.      ED Treatments / Results  Labs (all labs ordered are listed, but only abnormal results are displayed) Labs Reviewed    CBC WITH DIFFERENTIAL/PLATELET - Abnormal; Notable for the following components:      Result Value   RBC 5.35 (*)    MCV 79.6 (*)    MCH 24.1 (*)    Lymphs Abs 4.8 (*)    All other components within normal limits  BASIC METABOLIC PANEL - Abnormal; Notable for the following components:   Glucose, Bld 122 (*)    Creatinine, Ser 1.02 (*)    All other components within normal limits  RESPIRATORY PANEL BY PCR  INFLUENZA PANEL BY PCR (TYPE A & B)  PATHOLOGIST SMEAR REVIEW    EKG EKG Interpretation  Date/Time:  Thursday September 27 2018 18:41:30 EST Ventricular Rate:  117 PR Interval:    QRS Duration: 79 QT Interval:  307 QTC Calculation: 429 R Axis:   64 Text Interpretation:  Sinus tachycardia Probable left atrial enlargement RSR' in V1 or V2, right VCD or RVH Borderline T abnormalities, inferior leads No STEMI.  Confirmed by Alona Bene [phone]) on 09/27/2018 6:47:05 PM   Radiology Ct Angio Chest Pe W/cm &/or Wo Cm  Result Date: 09/27/2018 CLINICAL DATA:  Three day history of cough, shortness of breath and chest pain. EXAM: CT ANGIOGRAPHY CHEST WITH CONTRAST TECHNIQUE: Multidetector CT imaging of the chest was performed using the standard protocol during bolus administration of intravenous contrast. Multiplanar CT image reconstructions and MIPs were obtained to evaluate the vascular anatomy. CONTRAST:  ISOVUE-370 IOPAMIDOL (ISOVUE-370) INJECTION 76% COMPARISON:  None. FINDINGS: Cardiovascular: The heart is normal in size. No pericardial effusion. Small amount of fluid in the pericardial recesses. The aorta is normal in caliber. No dissection. No atherosclerotic calcifications. The branch vessels are patent. The pulmonary arterial tree is fairly well opacified. No filling defects to suggest pulmonary embolism. Mediastinum/Nodes: No mediastinal or hilar mass or lymphadenopathy. Small scattered lymph nodes are noted. The esophagus is grossly normal. Lungs/Pleura: Moderate eventration  of the right hemidiaphragm with overlying vascular crowding and streaky atelectasis. No infiltrates, edema or effusions. There are few scattered small subpleural nodules which are likely lymph nodes. No worrisome pulmonary lesions. Upper Abdomen: No significant upper abdominal findings. The spleen is only partially visualized but measures 13 x 9.5 cm. Could not exclude mild splenomegaly. Musculoskeletal: No breast masses, supraclavicular or axillary lymphadenopathy. Small scattered lymph nodes are noted. The thyroid gland is grossly normal. No significant bony findings. Review of the MIP images  confirms the above findings. IMPRESSION: 1. No CT findings for pulmonary embolism. 2. Normal thoracic aorta. 3. No acute pulmonary findings. 4. Eventration of the right hemidiaphragm with overlying vascular crowding and streaky atelectasis. 5. Possible splenomegaly. Limited ultrasound examination may be helpful for more accurate measurement. Electronically Signed   By: Rudie MeyerP.  Gallerani M.D.   On: 09/27/2018 17:09    Procedures Procedures (including critical care time)  Medications Ordered in ED Medications  ipratropium-albuterol (DUONEB) 0.5-2.5 (3) MG/3ML nebulizer solution 3 mL (3 mLs Nebulization Not Given 09/27/18 2000)  0.9 %  sodium chloride infusion (has no administration in time range)  iopamidol (ISOVUE-370) 76 % injection 100 mL (100 mLs Intravenous Contrast Given 09/27/18 1646)  sodium chloride 0.9 % bolus 1,000 mL (0 mLs Intravenous Stopped 09/27/18 2019)  acetaminophen (TYLENOL) tablet 1,000 mg (1,000 mg Oral Given 09/27/18 1834)  albuterol (PROVENTIL) (2.5 MG/3ML) 0.083% nebulizer solution 2.5 mg (2.5 mg Nebulization Given 09/27/18 2024)  sodium chloride 0.9 % bolus 1,000 mL (1,000 mLs Intravenous Transfusing/Transfer 09/27/18 2158)     Initial Impression / Assessment and Plan / ED Course  I have reviewed the triage vital signs and the nursing notes.  Pertinent labs & imaging results that were  available during my care of the patient were reviewed by me and considered in my medical decision making (see chart for details).     Pt presenting for evaluation of chest pain, cough, shortness of breath.  Physical exam concerning, patient is very tachycardic and tachypneic.  Additionally, is having cough, but no other URI symptoms.  Chest pain and shortness of breath began today.  Concern for possible PE, patient is on OCPs and obese.  Consider pneumonia or other lung infection.  Consider flu, although less likely due to no fever or other URI symptoms.  Will trial breathing treatment and reassess. Doubt cardiac cause or ACS at this time.   Labs without leukocytosis or electrolyte abnormality.  However, patient with abnormal lymphocytes, and pathology smear started.  On reassessment, patient remains tachycardic and tachypneic.  No change in sxs with breathing treatment.  PE scan pending.  PE scan negative for infection or PE. Shows mild atelectasis.  Continues to be tachycardic and tachypneic up to the 40s.  Fluid bolus started, Tylenol given for possible low-grade fever.  Patient with continued tachycardia and tachypnea, on ambulation sats remained stable but heart rate increased to 140.  Patient was visibly short of breath with ambulation.  Case discussed with attending, Dr. Jacqulyn BathLong evaluated the patient.  Consider possible early pneumonitis due to a viral illness.  Patient now with nasal congestion low-grade fever of 100.2.  However, this is out of proportion with her exam and the amount of tachypnea and tachycardia she has.  As such, will call for admission.  Discussed with Dr. Toniann FailKakrakandy from tried hospital service, patient to be admitted.  Patient's blood pressure recorded to be low with a automatic blood pressure cuff at 80/60.  Manual blood pressure improved at 110/84, however side ultrasound performed.  Ultrasound showed good motility, movement, and no pleural effusion.  [facility]   Final Clinical Impressions(s) / ED Diagnoses   Final diagnoses:  Tachycardia  Tachypnea  Cough  Atypical chest pain    ED Discharge Orders    None       Alveria ApleyCaccavale, Marylen Zuk, PA-C 09/28/18 0009    Maia PlanLong, Joshua G, MD 09/28/18 1029

## 2018-09-28 ENCOUNTER — Encounter (HOSPITAL_COMMUNITY): Payer: Self-pay | Admitting: Internal Medicine

## 2018-09-28 ENCOUNTER — Observation Stay (HOSPITAL_COMMUNITY): Payer: 59

## 2018-09-28 ENCOUNTER — Inpatient Hospital Stay (HOSPITAL_COMMUNITY): Payer: 59

## 2018-09-28 DIAGNOSIS — R0682 Tachypnea, not elsewhere classified: Secondary | ICD-10-CM | POA: Diagnosis present

## 2018-09-28 DIAGNOSIS — J9811 Atelectasis: Secondary | ICD-10-CM | POA: Diagnosis present

## 2018-09-28 DIAGNOSIS — R0609 Other forms of dyspnea: Secondary | ICD-10-CM

## 2018-09-28 DIAGNOSIS — R0600 Dyspnea, unspecified: Secondary | ICD-10-CM | POA: Diagnosis not present

## 2018-09-28 DIAGNOSIS — R161 Splenomegaly, not elsewhere classified: Secondary | ICD-10-CM | POA: Diagnosis present

## 2018-09-28 DIAGNOSIS — R000 Tachycardia, unspecified: Secondary | ICD-10-CM | POA: Diagnosis present

## 2018-09-28 DIAGNOSIS — G92 Toxic encephalopathy: Secondary | ICD-10-CM | POA: Diagnosis present

## 2018-09-28 DIAGNOSIS — J383 Other diseases of vocal cords: Secondary | ICD-10-CM | POA: Diagnosis present

## 2018-09-28 DIAGNOSIS — T368X5A Adverse effect of other systemic antibiotics, initial encounter: Secondary | ICD-10-CM | POA: Diagnosis not present

## 2018-09-28 DIAGNOSIS — J45909 Unspecified asthma, uncomplicated: Secondary | ICD-10-CM | POA: Diagnosis present

## 2018-09-28 DIAGNOSIS — R946 Abnormal results of thyroid function studies: Secondary | ICD-10-CM | POA: Diagnosis present

## 2018-09-28 DIAGNOSIS — T886XXA Anaphylactic reaction due to adverse effect of correct drug or medicament properly administered, initial encounter: Secondary | ICD-10-CM | POA: Diagnosis not present

## 2018-09-28 DIAGNOSIS — Z6841 Body Mass Index (BMI) 40.0 and over, adult: Secondary | ICD-10-CM | POA: Diagnosis not present

## 2018-09-28 DIAGNOSIS — J180 Bronchopneumonia, unspecified organism: Secondary | ICD-10-CM | POA: Diagnosis present

## 2018-09-28 DIAGNOSIS — X58XXXA Exposure to other specified factors, initial encounter: Secondary | ICD-10-CM | POA: Diagnosis present

## 2018-09-28 DIAGNOSIS — G9340 Encephalopathy, unspecified: Secondary | ICD-10-CM | POA: Diagnosis not present

## 2018-09-28 DIAGNOSIS — J9601 Acute respiratory failure with hypoxia: Secondary | ICD-10-CM | POA: Diagnosis not present

## 2018-09-28 LAB — HEPATIC FUNCTION PANEL
ALT: 43 U/L (ref 0–44)
AST: 42 U/L — AB (ref 15–41)
Albumin: 2.7 g/dL — ABNORMAL LOW (ref 3.5–5.0)
Alkaline Phosphatase: 41 U/L (ref 38–126)
Bilirubin, Direct: 0.3 mg/dL — ABNORMAL HIGH (ref 0.0–0.2)
Indirect Bilirubin: 0.2 mg/dL — ABNORMAL LOW (ref 0.3–0.9)
Total Bilirubin: 0.5 mg/dL (ref 0.3–1.2)
Total Protein: 6.8 g/dL (ref 6.5–8.1)

## 2018-09-28 LAB — RAPID URINE DRUG SCREEN, HOSP PERFORMED
AMPHETAMINES: NOT DETECTED
BARBITURATES: NOT DETECTED
Benzodiazepines: NOT DETECTED
Cocaine: NOT DETECTED
Opiates: NOT DETECTED
Tetrahydrocannabinol: NOT DETECTED

## 2018-09-28 LAB — CBC
HCT: 36.3 % (ref 36.0–46.0)
Hemoglobin: 11.6 g/dL — ABNORMAL LOW (ref 12.0–15.0)
MCH: 24.7 pg — ABNORMAL LOW (ref 26.0–34.0)
MCHC: 32 g/dL (ref 30.0–36.0)
MCV: 77.2 fL — ABNORMAL LOW (ref 80.0–100.0)
Platelets: 149 10*3/uL — ABNORMAL LOW (ref 150–400)
RBC: 4.7 MIL/uL (ref 3.87–5.11)
RDW: 14.8 % (ref 11.5–15.5)
WBC: 6.1 10*3/uL (ref 4.0–10.5)
nRBC: 0 % (ref 0.0–0.2)

## 2018-09-28 LAB — BASIC METABOLIC PANEL
Anion gap: 6 (ref 5–15)
BUN: <5 mg/dL — ABNORMAL LOW (ref 6–20)
CO2: 22 mmol/L (ref 22–32)
Calcium: 8.1 mg/dL — ABNORMAL LOW (ref 8.9–10.3)
Chloride: 109 mmol/L (ref 98–111)
Creatinine, Ser: 0.9 mg/dL (ref 0.44–1.00)
GFR calc non Af Amer: >60 mL/min (ref >60)
Glucose, Bld: 98 mg/dL (ref 70–99)
Potassium: 3.8 mmol/L (ref 3.5–5.1)
Sodium: 137 mmol/L (ref 135–145)

## 2018-09-28 LAB — T4, FREE: Free T4: 0.87 ng/dL (ref 0.82–1.77)

## 2018-09-28 LAB — ECHOCARDIOGRAM COMPLETE
HEIGHTINCHES: 62 in
Weight: 3795.44 oz

## 2018-09-28 LAB — HIV ANTIBODY (ROUTINE TESTING W REFLEX): HIV Screen 4th Generation wRfx: NONREACTIVE

## 2018-09-28 LAB — LIPASE, BLOOD: Lipase: 24 U/L (ref 11–51)

## 2018-09-28 LAB — BRAIN NATRIURETIC PEPTIDE: B Natriuretic Peptide: 4.6 pg/mL (ref 0.0–100.0)

## 2018-09-28 LAB — SEDIMENTATION RATE: Sed Rate: 21 mm/hr (ref 0–22)

## 2018-09-28 LAB — C-REACTIVE PROTEIN: CRP: 4.7 mg/dL — ABNORMAL HIGH (ref <1.0)

## 2018-09-28 LAB — TROPONIN I
Troponin I: <0.03 ng/mL (ref <0.03)
Troponin I: <0.03 ng/mL (ref <0.03)
Troponin I: <0.03 ng/mL (ref <0.03)

## 2018-09-28 LAB — PATHOLOGIST SMEAR REVIEW: Path Review: REACTIVE

## 2018-09-28 LAB — CK: Total CK: 90 U/L (ref 38–234)

## 2018-09-28 LAB — PREGNANCY, URINE: Preg Test, Ur: NEGATIVE

## 2018-09-28 LAB — TSH: TSH: 5.662 u[IU]/mL — ABNORMAL HIGH (ref 0.350–4.500)

## 2018-09-28 MED ORDER — IPRATROPIUM BROMIDE 0.02 % IN SOLN
0.5000 mg | Freq: Four times a day (QID) | RESPIRATORY_TRACT | Status: DC
  Administered 2018-09-28 – 2018-09-29 (×5): 0.5 mg via RESPIRATORY_TRACT
  Filled 2018-09-28 (×4): qty 2.5

## 2018-09-28 MED ORDER — DEXTROMETHORPHAN POLISTIREX ER 30 MG/5ML PO SUER
15.0000 mg | Freq: Two times a day (BID) | ORAL | Status: DC | PRN
  Filled 2018-09-28: qty 5

## 2018-09-28 MED ORDER — GUAIFENESIN ER 600 MG PO TB12
600.0000 mg | ORAL_TABLET | Freq: Two times a day (BID) | ORAL | Status: DC
  Administered 2018-09-28 – 2018-10-02 (×8): 600 mg via ORAL
  Filled 2018-09-28 (×8): qty 1

## 2018-09-28 MED ORDER — LEVALBUTEROL HCL 0.63 MG/3ML IN NEBU
0.6300 mg | INHALATION_SOLUTION | Freq: Four times a day (QID) | RESPIRATORY_TRACT | Status: DC | PRN
  Administered 2018-09-28: 0.63 mg via RESPIRATORY_TRACT
  Filled 2018-09-28: qty 3

## 2018-09-28 MED ORDER — SODIUM CHLORIDE 0.9 % IV BOLUS
500.0000 mL | Freq: Once | INTRAVENOUS | Status: AC
  Administered 2018-09-28: 500 mL via INTRAVENOUS

## 2018-09-28 MED ORDER — AZITHROMYCIN 250 MG PO TABS
500.0000 mg | ORAL_TABLET | Freq: Every day | ORAL | Status: DC
  Administered 2018-09-29 – 2018-10-02 (×4): 500 mg via ORAL
  Filled 2018-09-28 (×4): qty 2

## 2018-09-28 MED ORDER — ONDANSETRON HCL 4 MG/2ML IJ SOLN: 4.0000 mg | Freq: Four times a day (QID) | INTRAMUSCULAR | Status: DC | PRN

## 2018-09-28 MED ORDER — SODIUM CHLORIDE 0.9 % IV SOLN
1.0000 g | INTRAVENOUS | Status: DC
  Administered 2018-09-28 – 2018-09-29 (×2): 1 g via INTRAVENOUS
  Filled 2018-09-28 (×2): qty 10

## 2018-09-28 MED ORDER — LEVALBUTEROL HCL 0.63 MG/3ML IN NEBU
0.6300 mg | INHALATION_SOLUTION | Freq: Four times a day (QID) | RESPIRATORY_TRACT | Status: DC
  Administered 2018-09-28 – 2018-09-29 (×5): 0.63 mg via RESPIRATORY_TRACT
  Filled 2018-09-28 (×5): qty 3

## 2018-09-28 MED ORDER — ACETAMINOPHEN 325 MG PO TABS
650.0000 mg | ORAL_TABLET | Freq: Four times a day (QID) | ORAL | Status: DC | PRN
  Administered 2018-09-28 – 2018-09-29 (×3): 650 mg via ORAL
  Filled 2018-09-28 (×3): qty 2

## 2018-09-28 MED ORDER — ACETAMINOPHEN 650 MG RE SUPP: 650.0000 mg | Freq: Four times a day (QID) | RECTAL | Status: DC | PRN

## 2018-09-28 MED ORDER — SODIUM CHLORIDE 0.9 % IV SOLN
INTRAVENOUS | Status: DC
  Administered 2018-09-29 – 2018-09-30 (×2): 1000 mL via INTRAVENOUS

## 2018-09-28 MED ORDER — TRAMADOL HCL 50 MG PO TABS
50.0000 mg | ORAL_TABLET | Freq: Once | ORAL | Status: AC
  Administered 2018-09-28: 50 mg via ORAL
  Filled 2018-09-28: qty 1

## 2018-09-28 MED ORDER — ONDANSETRON HCL 4 MG PO TABS: 4.0000 mg | ORAL_TABLET | Freq: Four times a day (QID) | ORAL | Status: DC | PRN

## 2018-09-28 NOTE — Progress Notes (Signed)
Pt has refused any further attempts at ABG draw at this time.

## 2018-09-28 NOTE — Progress Notes (Signed)
RT attempted ABG x1 RR and x1 LR with only a flash of blood obtained. RT will attempt to find another RT to obtain.

## 2018-09-28 NOTE — H&P (Addendum)
History and Physical    Olivia Mccall AOZ:[id number] DOB: April 04, 1991 DOA: 09/27/2018  PCP: Patient, No Pcp Per  Patient coming from: Home.  Chief Complaint: Shortness of breath.  HPI: Olivia Mccall is a 28 y.o. female with no significant past medical history presents to the ER admits in [facility] with complaint of shortness of breath.  Patient has been having shortness of breath the last 3 days increased on exertion.  Prior to which patient had some epigastric discomfort last week which lasted for 3 days.  Presently patient has no pain.  Denies any fever chills nausea vomiting diarrhea or any recent travel or sick contacts.  ED Course: In the ER patient was tachypneic and tachycardic.  CT angiogram of the chest was negative for anything acute.  EKG was showing sinus tachycardia.  Patient's heart rate increased up to 140 improved with fluids.  Initially was given nebulizer treatment for possible wheezing.  Respiratory viral panel was sent and influenza has come back as negative rest of which are pending.  Patient admitted for acute respiratory failure with hypoxia cause not clear.  Review of Systems: As per HPI, rest all negative.   Past Medical History:  Diagnosis Date  . Medical history non-contributory     Past Surgical History:  Procedure Laterality Date  . NO PAST SURGERIES       reports that she has never smoked. She has never used smokeless tobacco. She reports that she does not drink alcohol or use drugs.  No Known Allergies  Family History  Problem Relation Age of Onset  . Diabetes Mellitus II Maternal Grandmother     Prior to Admission medications   Not on File    Physical Exam: Vitals:   09/27/18 2130 09/27/18 2132 09/27/18 2201 09/27/18 2308  BP: 110/84 110/84 126/74 121/85  Pulse: (!) 115  (!) 114 (!) 115  Resp: (!) 29  (!) 31 (!) 28  Temp: 98.6 F (37 C)   98.1 F (36.7 C)  TempSrc: Oral   Oral  SpO2: 98%  100% 97%  Weight:    107.6 kg    Height:    5\' 2"  (1.575 m)      Constitutional: Moderately built and nourished. Vitals:   09/27/18 2130 09/27/18 2132 09/27/18 2201 09/27/18 2308  BP: 110/84 110/84 126/74 121/85  Pulse: (!) 115  (!) 114 (!) 115  Resp: (!) 29  (!) 31 (!) 28  Temp: 98.6 F (37 C)   98.1 F (36.7 C)  TempSrc: Oral   Oral  SpO2: 98%  100% 97%  Weight:    107.6 kg  Height:    5\' 2"  (1.575 m)   Eyes: Anicteric no pallor. ENMT: No discharge from the ears eyes nose and mouth. Neck: No mass felt.  No neck rigidity. Respiratory: No rhonchi or crepitations. Cardiovascular: S1-S2 heard. Abdomen: Soft nontender bowel sounds present. Musculoskeletal: No edema.  No joint effusion. Skin: No rash. Neurologic: Alert awake oriented to time place and person.  Moves all extremities. Psychiatric: Appears normal.  Normal affect.   Labs on Admission: I have personally reviewed following labs and imaging studies  CBC: Recent Labs  Lab 09/27/18 1611  WBC 7.2  NEUTROABS 1.8  HGB 12.9  HCT 42.6  MCV 79.6*  PLT 166   Basic Metabolic Panel: Recent Labs  Lab 09/27/18 1611  NA 135  K 4.1  CL 102  CO2 26  GLUCOSE 122*  BUN 7  CREATININE 1.02*  CALCIUM 9.1   GFR: Estimated Creatinine Clearance: 95.6 mL/min (A) (by C-G formula based on SCr of 1.02 mg/dL (H)). Liver Function Tests: No results for input(s): AST, ALT, ALKPHOS, BILITOT, PROT, ALBUMIN in the last 168 hours. No results for input(s): LIPASE, AMYLASE in the last 168 hours. No results for input(s): AMMONIA in the last 168 hours. Coagulation Profile: No results for input(s): INR, PROTIME in the last 168 hours. Cardiac Enzymes: No results for input(s): CKTOTAL, CKMB, CKMBINDEX, TROPONINI in the last 168 hours. BNP (last 3 results) No results for input(s): PROBNP in the last 8760 hours. HbA1C: No results for input(s): HGBA1C in the last 72 hours. CBG: No results for input(s): GLUCAP in the last 168 hours. Lipid Profile: No results for  input(s): CHOL, HDL, LDLCALC, TRIG, CHOLHDL, LDLDIRECT in the last 72 hours. Thyroid Function Tests: No results for input(s): TSH, T4TOTAL, FREET4, T3FREE, THYROIDAB in the last 72 hours. Anemia Panel: No results for input(s): VITAMINB12, FOLATE, FERRITIN, TIBC, IRON, RETICCTPCT in the last 72 hours. Urine analysis:    Component Value Date/Time   COLORURINE YELLOW 04/06/2015 1140   APPEARANCEUR HAZY (A) 04/06/2015 1140   LABSPEC 1.020 04/06/2015 1140   PHURINE 7.0 04/06/2015 1140   GLUCOSEU NEGATIVE 04/06/2015 1140   HGBUR LARGE (A) 04/06/2015 1140   BILIRUBINUR NEGATIVE 04/06/2015 1140   KETONESUR 15 (A) 04/06/2015 1140   PROTEINUR NEGATIVE 04/06/2015 1140   UROBILINOGEN 1.0 04/06/2015 1140   NITRITE NEGATIVE 04/06/2015 1140   LEUKOCYTESUR SMALL (A) 04/06/2015 1140   Sepsis Labs: @LABRCNTIP (procalcitonin:4,lacticidven:4) ) Recent Results (from the past 240 hour(s))  Respiratory Panel by PCR     Status: None   Collection Time: 09/27/18  8:03 PM  Result Value Ref Range Status   Adenovirus NOT DETECTED NOT DETECTED Final   Coronavirus 229E NOT DETECTED NOT DETECTED Final    Comment: (NOTE) The Coronavirus on the Respiratory Panel, DOES NOT test for the novel  Coronavirus (2019 nCoV)    Coronavirus HKU1 NOT DETECTED NOT DETECTED Final   Coronavirus NL63 NOT DETECTED NOT DETECTED Final   Coronavirus OC43 NOT DETECTED NOT DETECTED Final   Metapneumovirus NOT DETECTED NOT DETECTED Final   Rhinovirus / Enterovirus NOT DETECTED NOT DETECTED Final   Influenza A NOT DETECTED NOT DETECTED Final   Influenza B NOT DETECTED NOT DETECTED Final   Parainfluenza Virus 1 NOT DETECTED NOT DETECTED Final   Parainfluenza Virus 2 NOT DETECTED NOT DETECTED Final   Parainfluenza Virus 3 NOT DETECTED NOT DETECTED Final   Parainfluenza Virus 4 NOT DETECTED NOT DETECTED Final   Respiratory Syncytial Virus NOT DETECTED NOT DETECTED Final   Bordetella pertussis NOT DETECTED NOT DETECTED Final    Chlamydophila pneumoniae NOT DETECTED NOT DETECTED Final   Mycoplasma pneumoniae NOT DETECTED NOT DETECTED Final    Comment: Performed at [facility], [address] [place], [place] [address]     Radiological Exams on Admission: Ct Angio Chest Pe W/cm &/or Wo Cm  Result Date: 09/27/2018 CLINICAL DATA:  Three day history of cough, shortness of breath and chest pain. EXAM: CT ANGIOGRAPHY CHEST WITH CONTRAST TECHNIQUE: Multidetector CT imaging of the chest was performed using the standard protocol during bolus administration of intravenous contrast. Multiplanar CT image reconstructions and MIPs were obtained to evaluate the vascular anatomy. CONTRAST:  100mL ISOVUE-370 IOPAMIDOL (ISOVUE-370) INJECTION 76% COMPARISON:  None. FINDINGS: Cardiovascular: The heart is normal in size. No pericardial effusion. Small amount of fluid in the pericardial recesses. The aorta is  normal in caliber. No dissection. No atherosclerotic calcifications. The branch vessels are patent. The pulmonary arterial tree is fairly well opacified. No filling defects to suggest pulmonary embolism. Mediastinum/Nodes: No mediastinal or hilar mass or lymphadenopathy. Small scattered lymph nodes are noted. The esophagus is grossly normal. Lungs/Pleura: Moderate eventration of the right hemidiaphragm with overlying vascular crowding and streaky atelectasis. No infiltrates, edema or effusions. There are few scattered small subpleural nodules which are likely lymph nodes. No worrisome pulmonary lesions. Upper Abdomen: No significant upper abdominal findings. The spleen is only partially visualized but measures 13 x 9.5 cm. Could not exclude mild splenomegaly. Musculoskeletal: No breast masses, supraclavicular or axillary lymphadenopathy. Small scattered lymph nodes are noted. The thyroid gland is grossly normal. No significant bony findings. Review of the MIP images confirms the above findings. IMPRESSION: 1. No CT findings for pulmonary  embolism. 2. Normal thoracic aorta. 3. No acute pulmonary findings. 4. Eventration of the right hemidiaphragm with overlying vascular crowding and streaky atelectasis. 5. Possible splenomegaly. Limited ultrasound examination may be helpful for more accurate measurement. Electronically Signed   By: Rudie Meyer M.D.   On: 09/27/2018 17:09    EKG: Independently reviewed.  Sinus tachycardia.  Assessment/Plan Principal Problem:   Acute respiratory failure with hypoxia (HCC)    1. Acute respiratory failure with hypoxia with tachypnea and tachycardia cause not clear.  CT angiogram does not show anything acute.  Will check further pending respiratory viral panel influenza was negative.  Since patient has exertional symptoms will check BNP 2D echo.  PRN Xopenex for now.  Since patient also has tachycardia will check TSH. 2. Possible splenomegaly seen in the CAT scan will need further work-up and follow-up.  Urine pregnancy screen and drug screen LFTs are pending.   DVT prophylaxis: Lovenox. Code Status: Full code. Family Communication: Discussed with patient. Disposition Plan: Home. Consults called: None. Admission status: Observation.   Eduard Clos MD [facility] Pager [phone].  If 7PM-7AM, please contact night-coverage [email or web address] Password [facility]  09/28/2018, 12:14 AM

## 2018-09-28 NOTE — Progress Notes (Signed)
PROGRESS NOTE    Ray ChurchLapria A Duck  EAV:[id number] DOB: [birth year] DOA: 09/27/2018 PCP: Patient, No Pcp Per    Brief Narrative: 28 year old with no significant past medical history who presents to the emergency department complaining of shortness of breath worse on exertion for the last 3 days.  Patient has been having also some epigastric discomfort.  In the ER patient was noted to be tachycardic tachypneic.  CT angios was negative for PE.  Show some elevation of the right hemi-diaphragma.  Influenza panel negative respiratory viral panel negative.   Assessment & Plan:   Principal Problem:   Acute respiratory failure with hypoxia (HCC)  Acute hypoxic, tachypnea respiratory failure; dyspnea CT angios negative for PE. Hydration will repeat chest x-ray.  Patient spiked fever.  Will cover for pneumonia with ceftriaxone and azithromycin.  Respiratory panel has been negative. We will schedule nebulizer. We will check 2D ECHO. ANA. HIV pending Respiratory viral panel negative.  Tachycardia: Suspect multifactorial related to fever: Acute infection. IV fluids.  He has stage mildly elevated.  Check free T3 free T4. Check  echo   Estimated body mass index is 43.39 kg/m as calculated from the following:   Height as of this encounter: 5\' 2"  (1.575 m).   Weight as of this encounter: 107.6 kg.   DVT prophylaxis: scd Code Status: full code Family Communication: care discussed with patient.  Disposition Plan: (remain in the hospital for Treatment of resp failure  Consultants:   none   Procedures:   ECHO   Antimicrobials:   Ceftriaxone, azithro   Subjective: She is still feeling SOB, worse on exertion.  Mother was diagnose with ;upus.   Objective: Vitals:   09/28/18 1018 09/28/18 1250 09/28/18 1351 09/28/18 1652  BP:  114/65  131/71  Pulse:  (!) 122  (!) 128  Resp:  (!) 34 (!) 28 (!) 30  Temp:  99.7 F (37.6 C)  (!) 101.8 F (38.8 C)  TempSrc:  Oral  Oral  SpO2:  99% 98%  95%  Weight:      Height:        Intake/Output Summary (Last 24 hours) at 09/28/2018 1824 Last data filed at 09/28/2018 0100 Gross per 24 hour  Intake 1237 ml  Output -  Net 1237 ml   Filed Weights   09/27/18 1349 09/27/18 2308  Weight: 108.4 kg 107.6 kg    Examination:  General exam: Appears calm and comfortable  Respiratory system: tachypnea, bilateral ronchus.  Cardiovascular system: S1 & S2 heard, RRR. No JVD, murmurs, rubs, gallops or clicks. No pedal edema. Gastrointestinal system: Abdomen is nondistended, soft and nontender. No organomegaly or masses felt. Normal bowel sounds heard. Central nervous system: Alert and oriented. No focal neurological deficits. Extremities: Symmetric 5 x 5 power. Skin: No rashes, lesions or ulcers Psychiatry: Judgement and insight appear normal. Mood & affect appropriate.     Data Reviewed: I have personally reviewed following labs and imaging studies  CBC: Recent Labs  Lab 09/27/18 1611 09/28/18 0654  WBC 7.2 6.1  NEUTROABS 1.8  --   HGB 12.9 11.6*  HCT 42.6 36.3  MCV 79.6* 77.2*  PLT 166 149*   Basic Metabolic Panel: Recent Labs  Lab 09/27/18 1611 09/28/18 0654  NA 135 137  K 4.1 3.8  CL 102 109  CO2 26 22  GLUCOSE 122* 98  BUN 7 <5*  CREATININE 1.02* 0.90  CALCIUM 9.1 8.1*   GFR: Estimated Creatinine Clearance: 108.4 mL/min (by C-G formula based on  SCr of 0.9 mg/dL). Liver Function Tests: Recent Labs  Lab 09/28/18 0215  AST 42*  ALT 43  ALKPHOS 41  BILITOT 0.5  PROT 6.8  ALBUMIN 2.7*   Recent Labs  Lab 09/28/18 0654  LIPASE 24   No results for input(s): AMMONIA in the last 168 hours. Coagulation Profile: No results for input(s): INR, PROTIME in the last 168 hours. Cardiac Enzymes: Recent Labs  Lab 09/28/18 0215 09/28/18 0654 09/28/18 1248  CKTOTAL 90  --   --   TROPONINI <0.03 <0.03 <0.03   BNP (last 3 results) No results for input(s): PROBNP in the last 8760 hours. HbA1C: No  results for input(s): HGBA1C in the last 72 hours. CBG: No results for input(s): GLUCAP in the last 168 hours. Lipid Profile: No results for input(s): CHOL, HDL, LDLCALC, TRIG, CHOLHDL, LDLDIRECT in the last 72 hours. Thyroid Function Tests: Recent Labs    09/28/18 0215 09/28/18 1248  TSH 5.662*  --   FREET4  --  0.87   Anemia Panel: No results for input(s): VITAMINB12, FOLATE, FERRITIN, TIBC, IRON, RETICCTPCT in the last 72 hours. Sepsis Labs: No results for input(s): PROCALCITON, LATICACIDVEN in the last 168 hours.  Recent Results (from the past 240 hour(s))  Respiratory Panel by PCR     Status: None   Collection Time: 09/27/18  8:03 PM  Result Value Ref Range Status   Adenovirus NOT DETECTED NOT DETECTED Final   Coronavirus 229E NOT DETECTED NOT DETECTED Final    Comment: (NOTE) The Coronavirus on the Respiratory Panel, DOES NOT test for the novel  Coronavirus (2019 nCoV)    Coronavirus HKU1 NOT DETECTED NOT DETECTED Final   Coronavirus NL63 NOT DETECTED NOT DETECTED Final   Coronavirus OC43 NOT DETECTED NOT DETECTED Final   Metapneumovirus NOT DETECTED NOT DETECTED Final   Rhinovirus / Enterovirus NOT DETECTED NOT DETECTED Final   Influenza A NOT DETECTED NOT DETECTED Final   Influenza B NOT DETECTED NOT DETECTED Final   Parainfluenza Virus 1 NOT DETECTED NOT DETECTED Final   Parainfluenza Virus 2 NOT DETECTED NOT DETECTED Final   Parainfluenza Virus 3 NOT DETECTED NOT DETECTED Final   Parainfluenza Virus 4 NOT DETECTED NOT DETECTED Final   Respiratory Syncytial Virus NOT DETECTED NOT DETECTED Final   Bordetella pertussis NOT DETECTED NOT DETECTED Final   Chlamydophila pneumoniae NOT DETECTED NOT DETECTED Final   Mycoplasma pneumoniae NOT DETECTED NOT DETECTED Final    Comment: Performed at [facility], [address], [place], [place] [address]         Radiology Studies: Ct Angio Chest Pe W/cm &/or Wo Cm  Result Date: 09/27/2018 CLINICAL DATA:  Three  day history of cough, shortness of breath and chest pain. EXAM: CT ANGIOGRAPHY CHEST WITH CONTRAST TECHNIQUE: Multidetector CT imaging of the chest was performed using the standard protocol during bolus administration of intravenous contrast. Multiplanar CT image reconstructions and MIPs were obtained to evaluate the vascular anatomy. CONTRAST:  ISOVUE-370 IOPAMIDOL (ISOVUE-370) INJECTION 76% COMPARISON:  None. FINDINGS: Cardiovascular: The heart is normal in size. No pericardial effusion. Small amount of fluid in the pericardial recesses. The aorta is normal in caliber. No dissection. No atherosclerotic calcifications. The branch vessels are patent. The pulmonary arterial tree is fairly well opacified. No filling defects to suggest pulmonary embolism. Mediastinum/Nodes: No mediastinal or hilar mass or lymphadenopathy. Small scattered lymph nodes are noted. The esophagus is grossly normal. Lungs/Pleura: Moderate eventration of the right hemidiaphragm with overlying vascular crowding  and streaky atelectasis. No infiltrates, edema or effusions. There are few scattered small subpleural nodules which are likely lymph nodes. No worrisome pulmonary lesions. Upper Abdomen: No significant upper abdominal findings. The spleen is only partially visualized but measures 13 x 9.5 cm. Could not exclude mild splenomegaly. Musculoskeletal: No breast masses, supraclavicular or axillary lymphadenopathy. Small scattered lymph nodes are noted. The thyroid gland is grossly normal. No significant bony findings. Review of the MIP images confirms the above findings. IMPRESSION: 1. No CT findings for pulmonary embolism. 2. Normal thoracic aorta. 3. No acute pulmonary findings. 4. Eventration of the right hemidiaphragm with overlying vascular crowding and streaky atelectasis. 5. Possible splenomegaly. Limited ultrasound examination may be helpful for more accurate measurement. Electronically Signed   By: Rudie Meyer M.D.   On:  09/27/2018 17:09   US Abdomen Complete  Result Date: 09/28/2018 CLINICAL DATA:  Splenomegaly.  Abnormal liver function studies. EXAM: ABDOMEN ULTRASOUND COMPLETE COMPARISON:  Chest CTA 09/27/2018 FINDINGS: Gallbladder: No gallstones or wall thickening visualized. No sonographic Murphy sign noted by sonographer. Common bile duct: Diameter: 2 mm Liver: Minimally heterogeneous echotexture in the left lobe without focal abnormality. Portal vein is patent on color Doppler imaging with normal direction of blood flow towards the liver. IVC: No abnormality visualized. Pancreas: Visualized portion unremarkable. Spleen: Size and appearance within normal limits. Measures 12.4 x 11.8 x 5.7 cm (volume = 440 cm^3). Right Kidney: Length: 9.5 cm. Echogenicity within normal limits. No mass or hydronephrosis visualized. Left Kidney: Length: 10.7 cm. Echogenicity within normal limits. No mass or hydronephrosis visualized. Abdominal aorta: No aneurysm visualized. Other findings: None. IMPRESSION: 1. The spleen is at the upper limits of normal for size and demonstrates no focal abnormality. 2. No biliary dilatation. 3. No acute abdominal findings. Electronically Signed   By: Carey Bullocks M.D.   On: 09/28/2018 16:02        Scheduled Meds: . azithromycin  500 mg Oral Daily  . guaiFENesin  600 mg Oral BID  . ipratropium  0.5 mg Nebulization Q6H  . levalbuterol  0.63 mg Nebulization Q6H   Continuous Infusions: . sodium chloride    . cefTRIAXone (ROCEPHIN)  IV    . sodium chloride       LOS: 0 days    Time spent: 35 minutes.     Alba Cory, MD [facility]  09/28/2018, 6:24 PM

## 2018-09-28 NOTE — Progress Notes (Signed)
  Echocardiogram 2D Echocardiogram has been performed.  Olivia Mccall 09/28/2018, 1:16 PM

## 2018-09-29 ENCOUNTER — Inpatient Hospital Stay (HOSPITAL_COMMUNITY): Payer: 59

## 2018-09-29 LAB — CBC
HCT: 38.5 % (ref 36.0–46.0)
Hemoglobin: 11.8 g/dL — ABNORMAL LOW (ref 12.0–15.0)
MCH: 23.9 pg — AB (ref 26.0–34.0)
MCHC: 30.6 g/dL (ref 30.0–36.0)
MCV: 78.1 fL — ABNORMAL LOW (ref 80.0–100.0)
Platelets: 153 10*3/uL (ref 150–400)
RBC: 4.93 MIL/uL (ref 3.87–5.11)
RDW: 15.2 % (ref 11.5–15.5)
WBC: 7.5 10*3/uL (ref 4.0–10.5)
nRBC: 0 % (ref 0.0–0.2)

## 2018-09-29 LAB — BASIC METABOLIC PANEL
Anion gap: 12 (ref 5–15)
CO2: 21 mmol/L — ABNORMAL LOW (ref 22–32)
Calcium: 8.4 mg/dL — ABNORMAL LOW (ref 8.9–10.3)
Chloride: 102 mmol/L (ref 98–111)
Creatinine, Ser: 0.98 mg/dL (ref 0.44–1.00)
GFR calc Af Amer: >60 mL/min (ref >60)
GFR calc non Af Amer: >60 mL/min (ref >60)
GLUCOSE: 116 mg/dL — AB (ref 70–99)
Potassium: 4.1 mmol/L (ref 3.5–5.1)
Sodium: 135 mmol/L (ref 135–145)

## 2018-09-29 LAB — T3, FREE: T3, Free: 2.4 pg/mL (ref 2.0–4.4)

## 2018-09-29 LAB — URINALYSIS, ROUTINE W REFLEX MICROSCOPIC
BILIRUBIN URINE: NEGATIVE
Glucose, UA: NEGATIVE mg/dL
KETONES UR: NEGATIVE mg/dL
Nitrite: NEGATIVE
PH: 6 (ref 5.0–8.0)
Protein, ur: NEGATIVE mg/dL
SPECIFIC GRAVITY, URINE: 1.014 (ref 1.005–1.030)

## 2018-09-29 LAB — EXPECTORATED SPUTUM ASSESSMENT W GRAM STAIN, RFLX TO RESP C

## 2018-09-29 LAB — ANA: Anti Nuclear Antibody(ANA): NEGATIVE

## 2018-09-29 MED ORDER — IPRATROPIUM BROMIDE 0.02 % IN SOLN
0.5000 mg | Freq: Two times a day (BID) | RESPIRATORY_TRACT | Status: DC
  Administered 2018-09-29 – 2018-09-30 (×2): 0.5 mg via RESPIRATORY_TRACT
  Filled 2018-09-29 (×2): qty 2.5

## 2018-09-29 MED ORDER — ENOXAPARIN SODIUM 40 MG/0.4ML ~~LOC~~ SOLN
40.0000 mg | SUBCUTANEOUS | Status: DC
  Administered 2018-09-29: 40 mg via SUBCUTANEOUS
  Filled 2018-09-29: qty 0.4

## 2018-09-29 MED ORDER — FLUTICASONE PROPIONATE 50 MCG/ACT NA SUSP
1.0000 | Freq: Every day | NASAL | Status: DC
  Administered 2018-09-29 – 2018-10-02 (×4): 1 via NASAL
  Filled 2018-09-29: qty 16

## 2018-09-29 MED ORDER — TRAMADOL HCL 50 MG PO TABS
50.0000 mg | ORAL_TABLET | Freq: Four times a day (QID) | ORAL | Status: DC | PRN
  Administered 2018-09-29 – 2018-09-30 (×5): 50 mg via ORAL
  Filled 2018-09-29 (×5): qty 1

## 2018-09-29 MED ORDER — LEVALBUTEROL HCL 0.63 MG/3ML IN NEBU
0.6300 mg | INHALATION_SOLUTION | Freq: Two times a day (BID) | RESPIRATORY_TRACT | Status: DC
  Administered 2018-09-29 – 2018-09-30 (×2): 0.63 mg via RESPIRATORY_TRACT
  Filled 2018-09-29 (×2): qty 3

## 2018-09-29 NOTE — Progress Notes (Signed)
PROGRESS NOTE    Olivia Mccall  RUE:[id number] DOB: 10-27-1990 DOA: 09/27/2018 PCP: Patient, No Pcp Per    Brief Narrative: 28 year old with no significant past medical history who presents to the emergency department complaining of shortness of breath worse on exertion for the last 3 days.  Patient has been having also some epigastric discomfort.  In the ER patient was noted to be tachycardic tachypneic.  CT angios was negative for PE.  Show some elevation of the right hemi-diaphragma.  Influenza panel negative respiratory viral panel negative.   Assessment & Plan:   Principal Problem:   Acute respiratory failure with hypoxia (HCC)  Acute hypoxic, tachypnea respiratory failure; dyspnea; secondary to community-acquired pneumonia CT angios negative for PE. Hydration will repeat chest x-Olivia.  Patient spiked fever.  Will cover for pneumonia with ceftriaxone and azithromycin.  Respiratory panel has been negative. Continue with schedule nebulizer. ECHO; normal ejection fraction no diastolic dysfunction ANA.  Pending HIV negative Respiratory viral panel negative. Repeated chest x-Olivia today after hydration showed bilateral lower lobe infiltrates consistent with pneumonia. Continue with ceftriaxone and azithromycin. Check blood cultures.Marland Kitchen. Sputum culture  Sinus tachycardia: Suspect multifactorial related to fever: Acute infection. IV fluids.  He has stage mildly elevated.   free T3 free T4 normal. Echo normal.  Fever; suspect related to pneumonia chest x-Olivia today showed bilateral infiltrates. Patient has frontal headache, and neck pain.  She does not have any nuchal rigidity.  She is alert and oriented.  Now we have a reason for her fever with a chest x-Olivia positive for pneumonia.. We will hold on lumbar puncture at this time.  Headache; Tylenol, tramadol.  Start Flonase in case of some sinus congestion.  Her headache is frontal area  Estimated body mass index is 43.39 kg/m as  calculated from the following:   Height as of this encounter: 5\' 2"  (1.575 m).   Weight as of this encounter: 107.6 kg.   DVT prophylaxis: scd Code Status: full code Family Communication: care discussed with patient, and mother in the room and mother over the phone Disposition Plan: (remain in the hospital for Treatment of resp failure  Consultants:   none   Procedures:   ECHO   Antimicrobials:   Ceftriaxone, azithro   Subjective: Patient is still with shortness of breath on exertion.  She now reports some productive cough.  She is still feeling tired.  She reports headache frontal headache.  She is having some neck and shoulder pain. She denies abdominal pain Objective: Vitals:   09/29/18 0848 09/29/18 1041 09/29/18 1120 09/29/18 1141  BP:   123/65   Pulse: (!) 123 (!) 117 (!) 115 (!) 111  Resp: (!) 32 (!) 30 (!) 27 (!) 26  Temp:   98.2 F (36.8 C)   TempSrc:   Oral   SpO2: 97% 96% 98% 97%  Weight:      Height:        Intake/Output Summary (Last 24 hours) at 09/29/2018 1524 Last data filed at 09/29/2018 1100 Gross per 24 hour  Intake 1561.21 ml  Output -  Net 1561.21 ml   Filed Weights   09/27/18 1349 09/27/18 2308  Weight: 108.4 kg 107.6 kg    Examination:  General exam: No acute distress Respiratory system: Bilateral rhonchorous Cardiovascular system: S1-S2 regular tachycardia Gastrointestinal system: Bowel sounds present, soft nontender nondistended Central nervous system: Alert, nonfocal Extremities: Symmetric  power Skin: no rashes.   Data Reviewed: I have personally reviewed following labs and imaging  studies  CBC: Recent Labs  Lab 09/27/18 1611 09/28/18 0654 09/29/18 0311  WBC 7.2 6.1 7.5  NEUTROABS 1.8  --   --   HGB 12.9 11.6* 11.8*  HCT 42.6 36.3 38.5  MCV 79.6* 77.2* 78.1*  PLT 166 149* 153   Basic Metabolic Panel: Recent Labs  Lab 09/27/18 1611 09/28/18 0654 09/29/18 0311  NA 135 137 135  K 4.1 3.8 4.1  CL 102 109 102    CO2 26 22 21*  GLUCOSE 122* 98 116*  BUN 7 <5* <5*  CREATININE 1.02* 0.90 0.98  CALCIUM 9.1 8.1* 8.4*   GFR: Estimated Creatinine Clearance: 99.5 mL/min (by C-G formula based on SCr of 0.98 mg/dL). Liver Function Tests: Recent Labs  Lab 09/28/18 0215  AST 42*  ALT 43  ALKPHOS 41  BILITOT 0.5  PROT 6.8  ALBUMIN 2.7*   Recent Labs  Lab 09/28/18 0654  LIPASE 24   No results for input(s): AMMONIA in the last 168 hours. Coagulation Profile: No results for input(s): INR, PROTIME in the last 168 hours. Cardiac Enzymes: Recent Labs  Lab 09/28/18 0215 09/28/18 0654 09/28/18 1248  CKTOTAL 90  --   --   TROPONINI <0.03 <0.03 <0.03   BNP (last 3 results) No results for input(s): PROBNP in the last 8760 hours. HbA1C: No results for input(s): HGBA1C in the last 72 hours. CBG: No results for input(s): GLUCAP in the last 168 hours. Lipid Profile: No results for input(s): CHOL, HDL, LDLCALC, TRIG, CHOLHDL, LDLDIRECT in the last 72 hours. Thyroid Function Tests: Recent Labs    09/28/18 0215 09/28/18 0811 09/28/18 1248  TSH 5.662*  --   --   FREET4  --   --  0.87  T3FREE  --  2.4  --    Anemia Panel: No results for input(s): VITAMINB12, FOLATE, FERRITIN, TIBC, IRON, RETICCTPCT in the last 72 hours. Sepsis Labs: No results for input(s): PROCALCITON, LATICACIDVEN in the last 168 hours.  Recent Results (from the past 240 hour(s))  Respiratory Panel by PCR     Status: None   Collection Time: 09/27/18  8:03 PM  Result Value Ref Range Status   Adenovirus NOT DETECTED NOT DETECTED Final   Coronavirus 229E NOT DETECTED NOT DETECTED Final    Comment: (NOTE) The Coronavirus on the Respiratory Panel, DOES NOT test for the novel  Coronavirus (2019 nCoV)    Coronavirus HKU1 NOT DETECTED NOT DETECTED Final   Coronavirus NL63 NOT DETECTED NOT DETECTED Final   Coronavirus OC43 NOT DETECTED NOT DETECTED Final   Metapneumovirus NOT DETECTED NOT DETECTED Final   Rhinovirus /  Enterovirus NOT DETECTED NOT DETECTED Final   Influenza A NOT DETECTED NOT DETECTED Final   Influenza B NOT DETECTED NOT DETECTED Final   Parainfluenza Virus 1 NOT DETECTED NOT DETECTED Final   Parainfluenza Virus 2 NOT DETECTED NOT DETECTED Final   Parainfluenza Virus 3 NOT DETECTED NOT DETECTED Final   Parainfluenza Virus 4 NOT DETECTED NOT DETECTED Final   Respiratory Syncytial Virus NOT DETECTED NOT DETECTED Final   Bordetella pertussis NOT DETECTED NOT DETECTED Final   Chlamydophila pneumoniae NOT DETECTED NOT DETECTED Final   Mycoplasma pneumoniae NOT DETECTED NOT DETECTED Final    Comment: Performed at [facility], [address] [place], [place] [address]         Radiology Studies: Dg Chest 2 View  Result Date: 09/29/2018 CLINICAL DATA:  Shortness of breath with exertion over the last 3 days. Cough.  EXAM: CHEST - 2 VIEW COMPARISON:  CT 2 days ago. FINDINGS: Cardiomediastinal silhouette is normal. There is patchy bronchopneumonia in both lower lobes. No dense consolidation or lobar collapse. Tiny amount of fluid in the posterior costophrenic angles. IMPRESSION: Patchy bilateral lower lobe bronchopneumonia. Electronically Signed   By: Paulina Fusi M.D.   On: 09/29/2018 10:35   Ct Angio Chest Pe W/cm &/or Wo Cm  Result Date: 09/27/2018 CLINICAL DATA:  Three day history of cough, shortness of breath and chest pain. EXAM: CT ANGIOGRAPHY CHEST WITH CONTRAST TECHNIQUE: Multidetector CT imaging of the chest was performed using the standard protocol during bolus administration of intravenous contrast. Multiplanar CT image reconstructions and MIPs were obtained to evaluate the vascular anatomy. CONTRAST:  ISOVUE-370 IOPAMIDOL (ISOVUE-370) INJECTION 76% COMPARISON:  None. FINDINGS: Cardiovascular: The heart is normal in size. No pericardial effusion. Small amount of fluid in the pericardial recesses. The aorta is normal in caliber. No dissection. No atherosclerotic calcifications.  The branch vessels are patent. The pulmonary arterial tree is fairly well opacified. No filling defects to suggest pulmonary embolism. Mediastinum/Nodes: No mediastinal or hilar mass or lymphadenopathy. Small scattered lymph nodes are noted. The esophagus is grossly normal. Lungs/Pleura: Moderate eventration of the right hemidiaphragm with overlying vascular crowding and streaky atelectasis. No infiltrates, edema or effusions. There are few scattered small subpleural nodules which are likely lymph nodes. No worrisome pulmonary lesions. Upper Abdomen: No significant upper abdominal findings. The spleen is only partially visualized but measures 13 x 9.5 cm. Could not exclude mild splenomegaly. Musculoskeletal: No breast masses, supraclavicular or axillary lymphadenopathy. Small scattered lymph nodes are noted. The thyroid gland is grossly normal. No significant bony findings. Review of the MIP images confirms the above findings. IMPRESSION: 1. No CT findings for pulmonary embolism. 2. Normal thoracic aorta. 3. No acute pulmonary findings. 4. Eventration of the right hemidiaphragm with overlying vascular crowding and streaky atelectasis. 5. Possible splenomegaly. Limited ultrasound examination may be helpful for more accurate measurement. Electronically Signed   By: Rudie Meyer M.D.   On: 09/27/2018 17:09   US Abdomen Complete  Result Date: 09/28/2018 CLINICAL DATA:  Splenomegaly.  Abnormal liver function studies. EXAM: ABDOMEN ULTRASOUND COMPLETE COMPARISON:  Chest CTA 09/27/2018 FINDINGS: Gallbladder: No gallstones or wall thickening visualized. No sonographic Murphy sign noted by sonographer. Common bile duct: Diameter: 2 mm Liver: Minimally heterogeneous echotexture in the left lobe without focal abnormality. Portal vein is patent on color Doppler imaging with normal direction of blood flow towards the liver. IVC: No abnormality visualized. Pancreas: Visualized portion unremarkable. Spleen: Size and  appearance within normal limits. Measures 12.4 x 11.8 x 5.7 cm (volume = 440 cm^3). Right Kidney: Length: 9.5 cm. Echogenicity within normal limits. No mass or hydronephrosis visualized. Left Kidney: Length: 10.7 cm. Echogenicity within normal limits. No mass or hydronephrosis visualized. Abdominal aorta: No aneurysm visualized. Other findings: None. IMPRESSION: 1. The spleen is at the upper limits of normal for size and demonstrates no focal abnormality. 2. No biliary dilatation. 3. No acute abdominal findings. Electronically Signed   By: Carey Bullocks M.D.   On: 09/28/2018 16:02        Scheduled Meds: . azithromycin  500 mg Oral Daily  . fluticasone  1 spray Each Nare Daily  . guaiFENesin  600 mg Oral BID  . ipratropium  0.5 mg Nebulization BID  . levalbuterol  0.63 mg Nebulization BID   Continuous Infusions: . sodium chloride    . cefTRIAXone (ROCEPHIN)  IV 1  g (09/28/18 2038)     LOS: 1 day    Time spent: 35 minutes.     Alba Cory, MD [facility]  09/29/2018, 3:24 PM

## 2018-09-30 ENCOUNTER — Inpatient Hospital Stay (HOSPITAL_COMMUNITY): Payer: 59

## 2018-09-30 DIAGNOSIS — G92 Toxic encephalopathy: Secondary | ICD-10-CM

## 2018-09-30 DIAGNOSIS — J9601 Acute respiratory failure with hypoxia: Secondary | ICD-10-CM

## 2018-09-30 LAB — LACTIC ACID, PLASMA
Lactic Acid, Venous: 0.9 mmol/L (ref 0.5–1.9)
Lactic Acid, Venous: 0.9 mmol/L (ref 0.5–1.9)

## 2018-09-30 LAB — GLUCOSE, CAPILLARY: Glucose-Capillary: 105 mg/dL — ABNORMAL HIGH (ref 70–99)

## 2018-09-30 LAB — BASIC METABOLIC PANEL
Anion gap: 9 (ref 5–15)
BUN: 5 mg/dL — ABNORMAL LOW (ref 6–20)
CALCIUM: 8.1 mg/dL — AB (ref 8.9–10.3)
CO2: 21 mmol/L — ABNORMAL LOW (ref 22–32)
Chloride: 104 mmol/L (ref 98–111)
Creatinine, Ser: 0.82 mg/dL (ref 0.44–1.00)
GFR calc Af Amer: >60 mL/min (ref >60)
Glucose, Bld: 90 mg/dL (ref 70–99)
POTASSIUM: 4 mmol/L (ref 3.5–5.1)
Sodium: 134 mmol/L — ABNORMAL LOW (ref 135–145)

## 2018-09-30 LAB — EXPECTORATED SPUTUM ASSESSMENT W GRAM STAIN, RFLX TO RESP C: Special Requests: NORMAL

## 2018-09-30 LAB — CBC
HCT: 38.2 % (ref 36.0–46.0)
Hemoglobin: 11.7 g/dL — ABNORMAL LOW (ref 12.0–15.0)
MCH: 23.9 pg — ABNORMAL LOW (ref 26.0–34.0)
MCHC: 30.6 g/dL (ref 30.0–36.0)
MCV: 78 fL — AB (ref 80.0–100.0)
PLATELETS: 147 10*3/uL — AB (ref 150–400)
RBC: 4.9 MIL/uL (ref 3.87–5.11)
RDW: 15.3 % (ref 11.5–15.5)
WBC: 8.1 10*3/uL (ref 4.0–10.5)
nRBC: 0 % (ref 0.0–0.2)

## 2018-09-30 LAB — BLOOD GAS, ARTERIAL
Acid-base deficit: 1.3 mmol/L (ref 0.0–2.0)
Bicarbonate: 22.5 mmol/L (ref 20.0–28.0)
Drawn by: 365271
FIO2: 21
O2 Saturation: 88.7 %
PO2 ART: 57.6 mmHg — AB (ref 83.0–108.0)
Patient temperature: 99.4
pCO2 arterial: 35.8 mmHg (ref 32.0–48.0)
pH, Arterial: 7.418 (ref 7.350–7.450)

## 2018-09-30 LAB — PROCALCITONIN: Procalcitonin: <0.1 ng/mL

## 2018-09-30 LAB — STREP PNEUMONIAE URINARY ANTIGEN: Strep Pneumo Urinary Antigen: NEGATIVE

## 2018-09-30 LAB — MONONUCLEOSIS SCREEN: Mono Screen: NEGATIVE

## 2018-09-30 LAB — MRSA PCR SCREENING: MRSA by PCR: NEGATIVE

## 2018-09-30 MED ORDER — LEVALBUTEROL HCL 0.63 MG/3ML IN NEBU
0.6300 mg | INHALATION_SOLUTION | Freq: Three times a day (TID) | RESPIRATORY_TRACT | Status: DC
  Administered 2018-09-30 – 2018-10-01 (×3): 0.63 mg via RESPIRATORY_TRACT
  Filled 2018-09-30 (×4): qty 3

## 2018-09-30 MED ORDER — FAMOTIDINE IN NACL 20-0.9 MG/50ML-% IV SOLN
20.0000 mg | Freq: Once | INTRAVENOUS | Status: AC
  Administered 2018-09-30: 20 mg via INTRAVENOUS
  Filled 2018-09-30: qty 50

## 2018-09-30 MED ORDER — METHYLPREDNISOLONE SODIUM SUCC 40 MG IJ SOLR
40.0000 mg | Freq: Once | INTRAMUSCULAR | Status: AC
  Administered 2018-09-30: 40 mg via INTRAVENOUS
  Filled 2018-09-30: qty 1

## 2018-09-30 MED ORDER — VANCOMYCIN HCL IN DEXTROSE 1-5 GM/200ML-% IV SOLN
1000.0000 mg | Freq: Three times a day (TID) | INTRAVENOUS | Status: DC
  Filled 2018-09-30: qty 200

## 2018-09-30 MED ORDER — ENSURE ENLIVE PO LIQD
237.0000 mL | Freq: Two times a day (BID) | ORAL | Status: DC
  Administered 2018-09-30 – 2018-10-02 (×4): 237 mL via ORAL

## 2018-09-30 MED ORDER — DIPHENHYDRAMINE HCL 50 MG/ML IJ SOLN: 25.0000 mg | Freq: Four times a day (QID) | INTRAMUSCULAR | Status: DC | PRN

## 2018-09-30 MED ORDER — PANTOPRAZOLE SODIUM 40 MG IV SOLR: 40.0000 mg | Freq: Once | INTRAVENOUS | Status: DC

## 2018-09-30 MED ORDER — DIPHENHYDRAMINE HCL 50 MG/ML IJ SOLN
50.0000 mg | Freq: Once | INTRAMUSCULAR | Status: AC
  Administered 2018-09-30: 50 mg via INTRAVENOUS
  Filled 2018-09-30: qty 1

## 2018-09-30 MED ORDER — IPRATROPIUM BROMIDE 0.02 % IN SOLN
0.5000 mg | Freq: Two times a day (BID) | RESPIRATORY_TRACT | Status: DC
  Administered 2018-09-30: 0.5 mg via RESPIRATORY_TRACT
  Filled 2018-09-30 (×3): qty 2.5

## 2018-09-30 MED ORDER — VANCOMYCIN HCL 10 G IV SOLR
1750.0000 mg | Freq: Once | INTRAVENOUS | Status: AC
  Administered 2018-09-30: 1750 mg via INTRAVENOUS
  Filled 2018-09-30: qty 1750

## 2018-09-30 MED ORDER — SODIUM CHLORIDE 0.9 % IV SOLN
2.0000 g | INTRAVENOUS | Status: DC
  Filled 2018-09-30: qty 20

## 2018-09-30 MED ORDER — SODIUM CHLORIDE 0.9 % IV SOLN
2.0000 g | Freq: Two times a day (BID) | INTRAVENOUS | Status: DC
  Administered 2018-09-30: 2 g via INTRAVENOUS
  Filled 2018-09-30 (×2): qty 20

## 2018-09-30 NOTE — Progress Notes (Addendum)
PROGRESS NOTE    Olivia Mccall  YIR:[id number] DOB: August 31, 1990 DOA: 09/27/2018 PCP: Patient, No Pcp Per    Brief Narrative: 28 year old with no significant past medical history who presents to the emergency department complaining of shortness of breath worse on exertion for the last 3 days.  Patient has been having also some epigastric discomfort.  In the ER patient was noted to be tachycardic tachypneic.  CT angios was negative for PE.  Show some elevation of the right hemi-diaphragma.  Influenza panel negative respiratory viral panel negative.   Assessment & Plan:   Principal Problem:   Acute respiratory failure with hypoxia (HCC)  Acute, mild hypoxic tachypnea respiratory failure; dyspnea; secondary to community-acquired pneumonia CT angios negative for PE. Respiratory panel negative. Continue with schedule nebulizer. ECHO;normal ejection fraction no diastolic dysfunction ANA.  Negative. HIV negative Repeated chest x-ray today after hydration showed bilateral lower lobe infiltrates consistent with pneumonia. Continue with ceftriaxone and azithromycin day 3. L;actic acid normal BP stable no evidence of sepsis.  Blood cultures.. no growth to date. Sputum culture Patient continue to have SOB, exertion and talking. RR fluctuates 20---40. Will repeat Chest x ray this afternoon and will consult pulmonologist.   Sinus Tachycardia: Suspect multifactorial related to fever: Acute infection. Received IV fluids. TSH mildly elevated.  free T3 free T4 normal. Echo normal.  Fever:suspect related to pneumonia chest x-ray today showed bilateral infiltrates. Patient has frontal headache, and neck pain.  She does not have any nuchal rigidity.  She is alert and oriented.  Now we have a reason for her fever with a chest x-ray positive for pneumonia.. We will hold on lumbar puncture at this time.  Headache:Tylenol, tramadol.  Started  Flonase in case of some sinus congestion.  Her headache is  frontal area Headache and neck pain is better.  Mother Now mention to me that  patient is not herself since yesterday . She space off, stare. She had back pain prior to admission and a rash. Will get stat CT head. Will consult neurology. Will cover empirically. Mother consistently asking for a work up of meningitis.   Estimated body mass index is 43.39 kg/m as calculated from the following:   Height as of this encounter: 5\' 2"  (1.575 m).   Weight as of this encounter: 107.6 kg.   DVT prophylaxis: scd Code Status: full code Family Communication: care discussed with patient, and  mother over the phone Disposition Plan: (remain in the hospital for Treatment of resp failure  Consultants:   none   Procedures:   ECHO   Antimicrobials:   Ceftriaxone, azithro   Subjective: Still SOB on exertion, not better not worse.  Started to produce phlegm.  Headache and neck, shoulder pain is better.   Objective: Vitals:   09/29/18 2009 09/29/18 2040 09/29/18 2259 09/30/18 0300  BP: 134/81  115/65 128/78  Pulse: (!) 115  (!) 106 (!) 104  Resp: (!) 23  19 20   Temp: 98.7 F (37.1 C)  98.8 F (37.1 C) 98.7 F (37.1 C)  TempSrc: Oral  Oral Oral  SpO2: 98% 99% 93% 94%  Weight:      Height:        Intake/Output Summary (Last 24 hours) at 09/30/2018 0802 Last data filed at 09/30/2018 0727 Gross per 24 hour  Intake 1460.07 ml  Output -  Net 1460.07 ml   Filed Weights   09/27/18 1349 09/27/18 2308  Weight: 108.4 kg 107.6 kg    Examination:  General  exam: NAD Respiratory system: Bilateral crackles.  Cardiovascular system: S 1, S 2 RRR, tachycardia Gastrointestinal system: Bowel sound present, soft, nt Central nervous system: Non focal.  Extremities: Symmetric power.  Skin: No rashes.   Data Reviewed: I have personally reviewed following labs and imaging studies  CBC: Recent Labs  Lab 09/27/18 1611 09/28/18 0654 09/29/18 0311  WBC 7.2 6.1 7.5  NEUTROABS 1.8  --   --     HGB 12.9 11.6* 11.8*  HCT 42.6 36.3 38.5  MCV 79.6* 77.2* 78.1*  PLT 166 149* 153   Basic Metabolic Panel: Recent Labs  Lab 09/27/18 1611 09/28/18 0654 09/29/18 0311  NA 135 137 135  K 4.1 3.8 4.1  CL 102 109 102  CO2 26 22 21*  GLUCOSE 122* 98 116*  BUN 7 <5* <5*  CREATININE 1.02* 0.90 0.98  CALCIUM 9.1 8.1* 8.4*   GFR: Estimated Creatinine Clearance: 99.5 mL/min (by C-G formula based on SCr of 0.98 mg/dL). Liver Function Tests: Recent Labs  Lab 09/28/18 0215  AST 42*  ALT 43  ALKPHOS 41  BILITOT 0.5  PROT 6.8  ALBUMIN 2.7*   Recent Labs  Lab 09/28/18 0654  LIPASE 24   No results for input(s): AMMONIA in the last 168 hours. Coagulation Profile: No results for input(s): INR, PROTIME in the last 168 hours. Cardiac Enzymes: Recent Labs  Lab 09/28/18 0215 09/28/18 0654 09/28/18 1248  CKTOTAL 90  --   --   TROPONINI <0.03 <0.03 <0.03   BNP (last 3 results) No results for input(s): PROBNP in the last 8760 hours. HbA1C: No results for input(s): HGBA1C in the last 72 hours. CBG: No results for input(s): GLUCAP in the last 168 hours. Lipid Profile: No results for input(s): CHOL, HDL, LDLCALC, TRIG, CHOLHDL, LDLDIRECT in the last 72 hours. Thyroid Function Tests: Recent Labs    09/28/18 0215 09/28/18 0811 09/28/18 1248  TSH 5.662*  --   --   FREET4  --   --  0.87  T3FREE  --  2.4  --    Anemia Panel: No results for input(s): VITAMINB12, FOLATE, FERRITIN, TIBC, IRON, RETICCTPCT in the last 72 hours. Sepsis Labs: No results for input(s): PROCALCITON, LATICACIDVEN in the last 168 hours.  Recent Results (from the past 240 hour(s))  Respiratory Panel by PCR     Status: None   Collection Time: 09/27/18  8:03 PM  Result Value Ref Range Status   Adenovirus NOT DETECTED NOT DETECTED Final   Coronavirus 229E NOT DETECTED NOT DETECTED Final    Comment: (NOTE) The Coronavirus on the Respiratory Panel, DOES NOT test for the novel  Coronavirus (2019 nCoV)     Coronavirus HKU1 NOT DETECTED NOT DETECTED Final   Coronavirus NL63 NOT DETECTED NOT DETECTED Final   Coronavirus OC43 NOT DETECTED NOT DETECTED Final   Metapneumovirus NOT DETECTED NOT DETECTED Final   Rhinovirus / Enterovirus NOT DETECTED NOT DETECTED Final   Influenza A NOT DETECTED NOT DETECTED Final   Influenza B NOT DETECTED NOT DETECTED Final   Parainfluenza Virus 1 NOT DETECTED NOT DETECTED Final   Parainfluenza Virus 2 NOT DETECTED NOT DETECTED Final   Parainfluenza Virus 3 NOT DETECTED NOT DETECTED Final   Parainfluenza Virus 4 NOT DETECTED NOT DETECTED Final   Respiratory Syncytial Virus NOT DETECTED NOT DETECTED Final   Bordetella pertussis NOT DETECTED NOT DETECTED Final   Chlamydophila pneumoniae NOT DETECTED NOT DETECTED Final   Mycoplasma pneumoniae NOT DETECTED NOT DETECTED Final  Comment: Performed at [facility], [address], [place], [place] [address]  Expectorated sputum assessment w rflx to resp cult     Status: None   Collection Time: 09/29/18 10:38 AM  Result Value Ref Range Status   Specimen Description SPU  Final   Special Requests NONE  Final   Sputum evaluation   Final    Sputum specimen not acceptable for testing.  Please recollect.   Gram Stain Report Called to,Read Back By and Verified With: M BROOKS,RN AT 1945 09/29/2018 BY L BENFIELD Performed at [facility], [address] [place], [place] [address]    Report Status 09/29/2018 FINAL  Final  Culture, blood (routine x 2)     Status: None (Preliminary result)   Collection Time: 09/29/18 12:26 PM  Result Value Ref Range Status   Specimen Description BLOOD LEFT HAND  Final   Special Requests   Final    BOTTLES DRAWN AEROBIC ONLY Blood Culture adequate volume   Culture   Final    NO GROWTH < 24 HOURS Performed at [facility], [address], [place], [place] [address]    Report Status PENDING  Incomplete  Culture, blood (routine x 2)     Status: None (Preliminary result)    Collection Time: 09/29/18 12:32 PM  Result Value Ref Range Status   Specimen Description BLOOD RIGHT HAND  Final   Special Requests   Final    BOTTLES DRAWN AEROBIC ONLY Blood Culture adequate volume   Culture   Final    NO GROWTH < 24 HOURS Performed at [facility], [address] [place], [place] [address]    Report Status PENDING  Incomplete         Radiology Studies: Dg Chest 2 View  Result Date: 09/29/2018 CLINICAL DATA:  Shortness of breath with exertion over the last 3 days. Cough. EXAM: CHEST - 2 VIEW COMPARISON:  CT 2 days ago. FINDINGS: Cardiomediastinal silhouette is normal. There is patchy bronchopneumonia in both lower lobes. No dense consolidation or lobar collapse. Tiny amount of fluid in the posterior costophrenic angles. IMPRESSION: Patchy bilateral lower lobe bronchopneumonia. Electronically Signed   By: Paulina Fusi M.D.   On: 09/29/2018 10:35   US Abdomen Complete  Result Date: 09/28/2018 CLINICAL DATA:  Splenomegaly.  Abnormal liver function studies. EXAM: ABDOMEN ULTRASOUND COMPLETE COMPARISON:  Chest CTA 09/27/2018 FINDINGS: Gallbladder: No gallstones or wall thickening visualized. No sonographic Murphy sign noted by sonographer. Common bile duct: Diameter: 2 mm Liver: Minimally heterogeneous echotexture in the left lobe without focal abnormality. Portal vein is patent on color Doppler imaging with normal direction of blood flow towards the liver. IVC: No abnormality visualized. Pancreas: Visualized portion unremarkable. Spleen: Size and appearance within normal limits. Measures 12.4 x 11.8 x 5.7 cm (volume = 440 cm^3). Right Kidney: Length: 9.5 cm. Echogenicity within normal limits. No mass or hydronephrosis visualized. Left Kidney: Length: 10.7 cm. Echogenicity within normal limits. No mass or hydronephrosis visualized. Abdominal aorta: No aneurysm visualized. Other findings: None. IMPRESSION: 1. The spleen is at the upper limits of normal for size and  demonstrates no focal abnormality. 2. No biliary dilatation. 3. No acute abdominal findings. Electronically Signed   By: Carey Bullocks M.D.   On: 09/28/2018 16:02        Scheduled Meds: . azithromycin  500 mg Oral Daily  . enoxaparin (LOVENOX) injection  40 mg Subcutaneous Q24H  . fluticasone  1 spray Each  Nare Daily  . guaiFENesin  600 mg Oral BID  . ipratropium  0.5 mg Nebulization BID  . levalbuterol  0.63 mg Nebulization BID   Continuous Infusions: . cefTRIAXone (ROCEPHIN)  IV 1 g (09/29/18 2024)     LOS: 2 days    Time spent: 35 minutes.     Alba CoryBelkys A Tranika Scholler, MD [facility]  09/30/2018, 8:02 AM

## 2018-09-30 NOTE — Progress Notes (Signed)
Patient is OTF. Will attempt ABG when patient returns. RN aware.

## 2018-09-30 NOTE — Progress Notes (Signed)
Vanc reaction  Rn stated that pt had some reaction to IV vanc with her skin/back, ect. There was about 50 ml left in the bag which means that she got most of it. 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 Rather than changing to an alternative, we will reduce the infusion rate to avoid this.   Ulyses Southward, PharmD, BCIDP, AAHIVP, CPP Infectious Disease Pharmacist 09/30/2018 8:52 PM

## 2018-09-30 NOTE — Consult Note (Signed)
Neurology Consultation  Reason for Consult: Altered mental status Referring Physician: Sunnie Nielsen, MD  CC: Altered mental status  History is obtained from: Patient, mother, chart  HPI: Olivia Mccall is a 28 y.o. female with no significant past medical history, presented to the emergency room on 09/27/2018 for shortness of breath and was admitted for evaluation of acute respiratory failure with hypoxia tachypnea and tachycardia of unclear cause after the chest imaging studies were negative for PE. A viral influenza panel was done that was negative.  She continues to be hypoxic and according to the family is also more lethargic than usual.  On initial examination as documented in the H&P, she was hypoxic, tachycardic and had 1 recorded temperature of 101.8.  She had been evaluated by the pulmonology/critical care team who recommend that she be treated for community-acquired pneumonia and CT chest without contrast and also check for pro calcitonin, urine strep and urine Legionella antigens.  No concern for pulmonary embolism.  They need to rule out asthma as well.  She has some vocal cord dysfunction but also will be addressed by critical care.  She was also noted to have splenomegaly, cause unknown.  The hospitalist has requested a neurology consult because the family's concern for meningitis. The patient's mother said that she developed a rash with some back pain a couple of weeks ago and that is what made her concern for meningitis.  Initially the patient's chest x-rays were unremarkable but repeat studies have shown possible bilateral bibasilar pneumonias. Patient has been sick and not feeling well for the past 2 weeks.  There is a young child in the family that she lives with at home who was sick with influenza.  Patient gets headaches off and on had not been complaining of more headaches in the preceding days to the presentation.    Also of note, patient was started on empiric meningitic coverage  by the hospitalist today and she had an adverse reaction to vancomycin and broke out in a rash.  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  Denies smoking, alcohol or illicit drug use.  Review of systems is positive for lethargy, shortness of breath, mild chest discomfort. Review of systems is negative for any lateralizing tingling numbness weakness.  Negative for abdominal pain nausea vomiting.   ROS: ROS was performed and is negative except as noted in the HPI.   Past Medical History:  Diagnosis Date  . Medical history non-contributory     Family History  Problem Relation Age of Onset  . Diabetes Mellitus II Maternal Grandmother     Social History:   reports that she has never smoked. She has never used smokeless tobacco. She reports that she does not drink alcohol or use drugs.  Medications  Current Facility-Administered Medications:  .  acetaminophen (TYLENOL) tablet 650 mg, 650 mg, Oral, Q6H PRN, 650 mg at 09/29/18 2039 **OR** acetaminophen (TYLENOL) suppository 650 mg, 650 mg, Rectal, Q6H PRN, Eduard Clos, MD .  azithromycin [facility]) tablet 500 mg, 500 mg, Oral, Daily, Regalado, Belkys A, MD, 500 mg at 09/30/18 0900 .  cefTRIAXone (ROCEPHIN) 2 g in sodium chloride 0.9 % 100 mL IVPB, 2 g, Intravenous, Q12H, Regalado, Belkys A, MD .  dextromethorphan (DELSYM) 30 MG/5ML liquid 15 mg, 15 mg, Oral, BID PRN, Regalado, Belkys A, MD .  diphenhydrAMINE (BENADRYL) injection 25 mg, 25 mg, Intravenous, Q6H PRN, Bruna Potter, Andi Devon  T, NP .  famotidine (PEPCID) IVPB 20 mg premix, 20 mg, Intravenous, Once, Blount, Xenia T, NP .  feeding supplement (ENSURE ENLIVE) (ENSURE ENLIVE) liquid 237 mL, 237 mL, Oral, BID BM, Regalado, Belkys A, MD, 237 mL at 09/30/18 1408 .  fluticasone (FLONASE) 50 MCG/ACT nasal spray 1 spray, 1 spray, Each Nare, Daily, Regalado, Belkys A, MD, 1 spray at 09/30/18  0900 .  guaiFENesin (MUCINEX) 12 hr tablet 600 mg, 600 mg, Oral, BID, Regalado, Belkys A, MD, 600 mg at 09/30/18 0900 .  ipratropium (ATROVENT) nebulizer solution 0.5 mg, 0.5 mg, Nebulization, BID, Regalado, Belkys A, MD, 0.5 mg at 09/30/18 1435 .  levalbuterol (XOPENEX) nebulizer solution 0.63 mg, 0.63 mg, Nebulization, TID, Regalado, Belkys A, MD, 0.63 mg at 09/30/18 2010 .  ondansetron (ZOFRAN) tablet 4 mg, 4 mg, Oral, Q6H PRN **OR** ondansetron (ZOFRAN) injection 4 mg, 4 mg, Intravenous, Q6H PRN, Eduard Clos, MD .  traMADol Janean Sark) tablet 50 mg, 50 mg, Oral, Q6H PRN, Regalado, Belkys A, MD, 50 mg at 09/30/18 1502 .  [START ON 10/01/2018] vancomycin (VANCOCIN) IVPB 1000 mg/200 mL premix, 1,000 mg, Intravenous, Q8H, Regalado, Belkys A, MD  Exam: Current vital signs: BP 133/79 (BP Location: Left Arm)   Pulse (!) 114   Temp 99.9 F (37.7 C) (Oral)   Resp (!) 21   Ht 5\' 2"  (1.575 m)   Wt 107.6 kg   LMP 07/31/2018   SpO2 93%   BMI 43.39 kg/m  Vital signs in last 24 hours: Temp:  [97.6 F (36.4 C)-99.9 F (37.7 C)] 99.9 F (37.7 C) (02/16 2114) Pulse Rate:  [104-127] 114 (02/16 1923) Resp:  [17-35] 21 (02/16 1923) BP: (115-143)/(65-93) 133/79 (02/16 1923) SpO2:  [92 %-98 %] 93 % (02/16 2010) General: Drowsy lethargic, on supplemental oxygen in no apparent distress. HEENT: Normocephalic atraumatic no rashes on the face.  Supple neck-no neck stiffness CVs: S1-S2 heard, tachycardic. Respiratory: Decreased breath sounds all over with scattered rales.  Absent breath sounds bibasilarly. Abdomen: Obese, nontender. Extremities: Warm well perfused with no edema.  No rashes noted on the extremities.  Negative Kernig's and Brudzinski's Neurological exam She is drowsy and lethargic, opens eyes to voice, follows most simple commands consistently.  She is able to follow multistep commands as well but is slow to follow them. Her attention concentration is poor. Speech is clear.  Naming  comprehension repetition intact. Cranial: Pupils round reactive to light.  Extraocular movements are intact.  Visual fields are full.  Face is symmetric.  Facial sensation intact.  Tongue midline.  Palate elevates midline.  Shoulder shrug intact. Motor exam: 5/5 with no drift in any of the 4 extremities although she is very weak and has a hard time keeping the legs up for 5 seconds but on coaching was able to do that without any drift. Sensory exam: Intact light touch all over Coordination: Intact finger-nose-finger Gait testing deferred at this time. Meningitic signs- neck is supple, negative Kernig's and does not feel    Labs I have reviewed labs in epic and the results pertinent to this consultation are: No leukocytosis, mild hyponatremia, mild thrombocytopenia, microcytic anemia, low albumin and high AST with normal bilirubin. Normal pro calcitonin Negative ANA Negative viral influenza panel including coronavirus, metapneumovirus, rhinovirus, adenovirus, influenza a and B, parainfluenza 1234, RSV, and also negative Bordetella pertussis, chlamydiophila pneumonia, mycoplasma pneumonia.  Negative HIV.  Peripheral smear review done 3 years ago shows microcytic anemia with absolute lymphocytosis with reactive appearing lymphocytes.  CBC    Component Value Date/Time   WBC 8.1 09/30/2018 0859   RBC 4.90 09/30/2018 0859   HGB 11.7 (L) 09/30/2018 0859   HCT 38.2 09/30/2018 0859   PLT 147 (L) 09/30/2018 0859   MCV 78.0 (L) 09/30/2018 0859   MCH 23.9 (L) 09/30/2018 0859   MCHC 30.6 09/30/2018 0859   RDW 15.3 09/30/2018 0859   LYMPHSABS 4.8 (H) 09/27/2018 1611   MONOABS 0.5 09/27/2018 1611   EOSABS 0.0 09/27/2018 1611   BASOSABS 0.1 09/27/2018 1611    CMP     Component Value Date/Time   NA 134 (L) 09/30/2018 0859   K 4.0 09/30/2018 0859   CL 104 09/30/2018 0859   CO2 21 (L) 09/30/2018 0859   GLUCOSE 90 09/30/2018 0859   BUN 5 (L) 09/30/2018 0859   CREATININE 0.82 09/30/2018 0859    CALCIUM 8.1 (L) 09/30/2018 0859   PROT 6.8 09/28/2018 0215   ALBUMIN 2.7 (L) 09/28/2018 0215   AST 42 (H) 09/28/2018 0215   ALT 43 09/28/2018 0215   ALKPHOS 41 09/28/2018 0215   BILITOT 0.5 09/28/2018 0215   GFRNONAA >60 09/30/2018 0859   GFRAA >60 09/30/2018 0859   Imaging I have reviewed the images obtained:  CT-scan of the brain-no acute changes.  Assessment: 28 year old with no past medical history presents for 2 weeks of feeling unwell and acute shortness of breath and mild hypoxic respiratory failure probably in the setting of pneumonia.  Also found to have splenomegaly-because under investigation. She has been lethargic and there is been concern for her mentation being off of her baseline and family's concern for a CNS infection such as meningitis. A bacterial meningitis would have progressed faster than this, usually does not present with hypoxia and certainly not with splenomegaly although the old might be that headings but before any invasive studies are done for bacterial meningitis, brain MRI should be performed. Her symptoms could be explained by severe community-acquired pneumonia.  Splenomegaly should be noticed again further-question infectious mononucleosis.  Impression: Altered mental status-toxic metabolic encephalopathy which is multifactorial. Evaluate for any structural brain abnormality with an MRI Low suspicion for meningitis-supple neck, negative Kernig's Brudzinski's, no leukocytosis, other than one recorded spike of fever, no fevers.  Recommendations: MRI of the brain without contrast Supportive management per primary team as you are Consider testing for Epstein-Barr virus-ordered Monospot Further management of the symptomatically per primary team and other consultants in the care of the patient. We will follow with you after imaging results become available. Consider spinal tap if all other etiologies are completely ruled out as contributory for current  presentation.  Do not think it is indicated at this time.  Discussed my plan in detail with the family at bedside including patient's mother.  Relayed my plan to the covering provider for the hospital service and the rapid response RN and patient's RN.  Neurology will follow  -- Milon DikesAshish Nyeshia Mysliwiec, MD [facility] Pager: [phone] If 7pm to 7am, please call on call as listed on AMION.

## 2018-09-30 NOTE — Consult Note (Signed)
NAME:  Olivia Mccall, MRN:  [id number], DOB:  24-Feb-1991, LOS: 2 ADMISSION DATE:  09/27/2018, CONSULTATION DATE: September 30, 2018 REFERRING MD: Dr. Sunnie Nielsen CHIEF COMPLAINT: Shortness of breath and tachypnea despite treatment in the hospital  Brief History   See HPI  History of present illness    Olivia Mccall 28 y.o. female -no past medical history presented and admitted on February 13-14, 2020 with worsening shortness of breath for 3 days.  In the ER found to be tachycardic to sinus of 140/min.  Also reportedly hypoxemic although I do not know the pulse ox.  CT angiogram was negative for PE or any infiltrates.  Respiratory virus panel has come back negative.  CT scan did show elevation of the right hemidiaphragm.   though the CT chest in my personal visualization did not show any obvious pneumonia signs.  She does not seem to be on steroids.  Her eosinophils on the date of admission September 27, 2018 was normal at 100 cells per cubic millimeter.  It appears that she is unimproved since she got admitted.  She did have one episode of fever 101.8 on September 28, 2018 but her white count has been normal.She is being treated for community-acquired pneumonia with ceftriaxone and azithromycin since September 28, 2018 without any relapse of fever.  Follow-up chest x-Olivia on September 29, 2018 shows emergence of bilateral lower lobe infiltrates consistent with pneumonia.  She feels unimproved with cough but possibly colored sputum.  She is on oxygen 2 L although when we turned it off a pulse ox was 92% on room air.  But she definitely feels short of breath with exertion and relieved at rest.  Class III dyspnea.  She has morbid obesity.  Pulmonary critical care has been consulted September 30, 2018   Respiratory virus panel September 27, 2018-  -negative HIV test September 28 2018 is negative Urine pregnancy September 28, 2018 is negative Echocardiogram September 28, 2018 is normal  Past Medical History       has a past medical history of Medical history non-contributory.   reports that she has never smoked. She has never used smokeless tobacco.  Past Surgical History:  Procedure Laterality Date  . NO PAST SURGERIES      No Known Allergies   There is no immunization history on file for this patient.  Family History  Problem Relation Age of Onset  . Diabetes Mellitus II Maternal Grandmother      Current Facility-Administered Medications:  .  acetaminophen (TYLENOL) tablet 650 mg, 650 mg, Oral, Q6H PRN, 650 mg at 09/29/18 2039 **OR** acetaminophen (TYLENOL) suppository 650 mg, 650 mg, Rectal, Q6H PRN, Eduard Clos, MD .  azithromycin [facility]) tablet 500 mg, 500 mg, Oral, Daily, Regalado, Belkys A, MD, 500 mg at 09/30/18 0900 .  cefTRIAXone (ROCEPHIN) 2 g in sodium chloride 0.9 % 100 mL IVPB, 2 g, Intravenous, Q24H, Regalado, Belkys A, MD .  dextromethorphan (DELSYM) 30 MG/5ML liquid 15 mg, 15 mg, Oral, BID PRN, Regalado, Belkys A, MD .  enoxaparin (LOVENOX) injection 40 mg, 40 mg, Subcutaneous, Q24H, Regalado, Belkys A, MD, 40 mg at 09/29/18 1801 .  feeding supplement (ENSURE ENLIVE) (ENSURE ENLIVE) liquid 237 mL, 237 mL, Oral, BID BM, Regalado, Belkys A, MD, 237 mL at 09/30/18 1408 .  fluticasone (FLONASE) 50 MCG/ACT nasal spray 1 spray, 1 spray, Each Nare, Daily, Regalado, Belkys A, MD, 1 spray at 09/30/18 0900 .  guaiFENesin (MUCINEX) 12 hr tablet 600 mg, 600  mg, Oral, BID, Regalado, Belkys A, MD, 600 mg at 09/30/18 0900 .  ipratropium (ATROVENT) nebulizer solution 0.5 mg, 0.5 mg, Nebulization, BID, Regalado, Belkys A, MD, 0.5 mg at 09/30/18 1435 .  levalbuterol (XOPENEX) nebulizer solution 0.63 mg, 0.63 mg, Nebulization, TID, Regalado, Belkys A, MD, 0.63 mg at 09/30/18 1435 .  ondansetron (ZOFRAN) tablet 4 mg, 4 mg, Oral, Q6H PRN **OR** ondansetron (ZOFRAN) injection 4 mg, 4 mg, Intravenous, Q6H PRN, Eduard ClosKakrakandy, Arshad N, MD .  traMADol Janean Sark(ULTRAM) tablet 50 mg, 50 mg,  Oral, Q6H PRN, Regalado, Belkys A, MD, 50 mg at 09/30/18 0900   Significant Hospital Events   X  Consults:  September 30, 2018-pulmonary consult  Procedures:  X  Significant Diagnostic Tests:  X  Micro Data:  X  Antimicrobials:  September 28, 2018 ceftriaxone September 28, 2018 azithromycin  Interim history/subjective:  x  Objective   Blood pressure 128/79, pulse (!) 127, temperature 99.1 F (37.3 C), resp. rate 17, height 5\' 2"  (1.575 m), weight 107.6 kg, last menstrual period 07/31/2018, SpO2 94 %, unknown if currently breastfeeding.        Intake/Output Summary (Last 24 hours) at 09/30/2018 1446 Last data filed at 09/30/2018 1000 Gross per 24 hour  Intake 1540.07 ml  Output -  Net 1540.07 ml   Filed Weights   09/27/18 1349 09/27/18 2308  Weight: 108.4 kg 107.6 kg    Examination: General: Obese female looks a little bit anxious Psychiatry: Pleasant but mildly anxious/flat affect.  Boyfriend and grandmother at the bedside. HENT:?  Laryngeal quality to cough Lungs: Overall coarse.?  Occasional transmitted upper airway wheeze Cardiovascular: Regular rate and rhythm. Abdomen: Obese nontender no organomegaly Extremities: No cyanosis, no clubbing, no edema Neuro: Alert and oriented x3.  Speech normal GU: Normal   LABS    PULMONARY No results for input(s): PHART, PCO2ART, PO2ART, HCO3, TCO2, O2SAT in the last 168 hours.  Invalid input(s): PCO2, PO2  CBC Recent Labs  Lab 09/28/18 0654 09/29/18 0311 09/30/18 0859  HGB 11.6* 11.8* 11.7*  HCT 36.3 38.5 38.2  WBC 6.1 7.5 8.1  PLT 149* 153 147*    COAGULATION No results for input(s): INR in the last 168 hours.  CARDIAC   Recent Labs  Lab 09/28/18 0215 09/28/18 0654 09/28/18 1248  TROPONINI <0.03 <0.03 <0.03   No results for input(s): PROBNP in the last 168 hours.   CHEMISTRY Recent Labs  Lab 09/27/18 1611 09/28/18 0654 09/29/18 0311 09/30/18 0859  NA 135 137 135 134*  K 4.1 3.8 4.1 4.0   CL 102 109 102 104  CO2 26 22 21* 21*  GLUCOSE 122* 98 116* 90  BUN 7 <5* <5* 5*  CREATININE 1.02* 0.90 0.98 0.82  CALCIUM 9.1 8.1* 8.4* 8.1*   Estimated Creatinine Clearance: 118.9 mL/min (by C-G formula based on SCr of 0.82 mg/dL).   LIVER Recent Labs  Lab 09/28/18 0215  AST 42*  ALT 43  ALKPHOS 41  BILITOT 0.5  PROT 6.8  ALBUMIN 2.7*     INFECTIOUS Recent Labs  Lab 09/30/18 0859 09/30/18 1201  LATICACIDVEN 0.9 0.9     ENDOCRINE CBG (last 3)  No results for input(s): GLUCAP in the last 72 hours.       IMAGING x48h  - image(s) personally visualized  -   highlighted in bold Dg Chest 2 View  Result Date: 09/29/2018 CLINICAL DATA:  Shortness of breath with exertion over the last 3 days. Cough. EXAM: CHEST - 2 VIEW COMPARISON:  CT 2 days ago. FINDINGS: Cardiomediastinal silhouette is normal. There is patchy bronchopneumonia in both lower lobes. No dense consolidation or lobar collapse. Tiny amount of fluid in the posterior costophrenic angles. IMPRESSION: Patchy bilateral lower lobe bronchopneumonia. Electronically Signed   By: Paulina Fusi M.D.   On: 09/29/2018 10:35   US Abdomen Complete  Result Date: 09/28/2018 CLINICAL DATA:  Splenomegaly.  Abnormal liver function studies. EXAM: ABDOMEN ULTRASOUND COMPLETE COMPARISON:  Chest CTA 09/27/2018 FINDINGS: Gallbladder: No gallstones or wall thickening visualized. No sonographic Murphy sign noted by sonographer. Common bile duct: Diameter: 2 mm Liver: Minimally heterogeneous echotexture in the left lobe without focal abnormality. Portal vein is patent on color Doppler imaging with normal direction of blood flow towards the liver. IVC: No abnormality visualized. Pancreas: Visualized portion unremarkable. Spleen: Size and appearance within normal limits. Measures 12.4 x 11.8 x 5.7 cm (volume = 440 cm^3). Right Kidney: Length: 9.5 cm. Echogenicity within normal limits. No mass or hydronephrosis visualized. Left Kidney: Length:  10.7 cm. Echogenicity within normal limits. No mass or hydronephrosis visualized. Abdominal aorta: No aneurysm visualized. Other findings: None. IMPRESSION: 1. The spleen is at the upper limits of normal for size and demonstrates no focal abnormality. 2. No biliary dilatation. 3. No acute abdominal findings. Electronically Signed   By: Carey Bullocks M.D.   On: 09/28/2018 16:02     Resolved Hospital Problem list   x  Assessment & Plan:  Mild acute hypoxemic respiratory failure - persistent Shortness of breath -persistent  She could have developed community-acquired pneumonia on the imaging after admission when she got hydrated.  Therefore we will get CT scan of the chest without contrast to compare the 2 scans.  Chest x-Olivia can lack and sensitivity.  Will check procalcitonin, urine strep, urine Legionella antigens  Appears  pulmonary embolism has been ruled out.  Need to rule out potential asthma.  Reassuring eosinophil counts were normal.  We will check blood allergy profile and IgE.  Depending on the course of these results we might have to consider steroid therapy  Rule in: associated vocal cord dysfunction: We will get a pulmonary function test to look at the flow volume loop.  Elevated hemidiaphragm: We will get a sniff test  Pulmonary will follow  Best practice:  Diet: per [facility] MD Pain/Anxiety/Delirium protocol (if indicated): per [facility] MD VAP protocol (if indicated): na DVT prophylaxis: per [facility] MD GI prophylaxis: per [facility] MD Glucose control: per [facility] MD Mobility: per [facility] MD Code Status: full Family Communication: grandmother Disposition: 2w unit    SIGNATURE    Dr. Kalman Shan, M.D., F.C.C.P,  Pulmonary and Critical Care Medicine Staff Physician, [facility] Center Director - Interstitial Lung Disease  Program  Pulmonary Fibrosis [facility] at [facility] [place], [place], [address]  Pager: [phone], If no answer or between   15:00h - 7:00h: call [phone] Telephone: [phone]  2:46 PM 09/30/2018

## 2018-10-01 ENCOUNTER — Inpatient Hospital Stay (HOSPITAL_COMMUNITY): Payer: 59

## 2018-10-01 DIAGNOSIS — G9340 Encephalopathy, unspecified: Secondary | ICD-10-CM

## 2018-10-01 DIAGNOSIS — R0600 Dyspnea, unspecified: Secondary | ICD-10-CM

## 2018-10-01 LAB — CBC
HEMATOCRIT: 39.7 % (ref 36.0–46.0)
Hemoglobin: 12.6 g/dL (ref 12.0–15.0)
MCH: 24.7 pg — ABNORMAL LOW (ref 26.0–34.0)
MCHC: 31.7 g/dL (ref 30.0–36.0)
MCV: 77.7 fL — ABNORMAL LOW (ref 80.0–100.0)
Platelets: 179 10*3/uL (ref 150–400)
RBC: 5.11 MIL/uL (ref 3.87–5.11)
RDW: 15.5 % (ref 11.5–15.5)
WBC: 6.5 10*3/uL (ref 4.0–10.5)
nRBC: 0 % (ref 0.0–0.2)

## 2018-10-01 LAB — PULMONARY FUNCTION TEST
DL/VA % pred: 120 %
DL/VA: 5.7 ml/min/mmHg/L
DLCO cor % pred: 28 %
DLCO cor: 6.04 ml/min/mmHg
DLCO unc % pred: 28 %
DLCO unc: 5.88 ml/min/mmHg
FEF 25-75 Post: 0.4 L/sec
FEF 25-75 Pre: 0.52 L/sec
FEF2575-%Change-Post: -22 %
FEF2575-%PRED-POST: 12 %
FEF2575-%Pred-Pre: 16 %
FEV1-%Change-Post: 5 %
FEV1-%Pred-Post: 30 %
FEV1-%Pred-Pre: 28 %
FEV1-Post: 0.79 L
FEV1-Pre: 0.75 L
FEV1FVC-%Change-Post: 21 %
FEV1FVC-%Pred-Pre: 72 %
FEV6-%Change-Post: -13 %
FEV6-%PRED-POST: 34 %
FEV6-%Pred-Pre: 40 %
FEV6-Post: 1.04 L
FEV6-Pre: 1.2 L
FEV6FVC-%Pred-Post: 100 %
FEV6FVC-%Pred-Pre: 100 %
FVC-%Change-Post: -13 %
FVC-%Pred-Post: 34 %
FVC-%Pred-Pre: 39 %
FVC-Post: 1.04 L
FVC-Pre: 1.2 L
POST FEV1/FVC RATIO: 76 %
Post FEV6/FVC ratio: 100 %
Pre FEV1/FVC ratio: 62 %
Pre FEV6/FVC Ratio: 100 %
RV % pred: 86 %
RV: 1.06 L
TLC % pred: 42 %
TLC: 2 L

## 2018-10-01 LAB — URINE CULTURE: CULTURE: NO GROWTH

## 2018-10-01 LAB — BASIC METABOLIC PANEL
Anion gap: 10 (ref 5–15)
BUN: 6 mg/dL (ref 6–20)
CO2: 23 mmol/L (ref 22–32)
Calcium: 8.7 mg/dL — ABNORMAL LOW (ref 8.9–10.3)
Chloride: 102 mmol/L (ref 98–111)
Creatinine, Ser: 0.79 mg/dL (ref 0.44–1.00)
GFR calc Af Amer: >60 mL/min (ref >60)
GFR calc non Af Amer: >60 mL/min (ref >60)
GLUCOSE: 146 mg/dL — AB (ref 70–99)
Potassium: 4.2 mmol/L (ref 3.5–5.1)
Sodium: 135 mmol/L (ref 135–145)

## 2018-10-01 LAB — LEGIONELLA PNEUMOPHILA SEROGP 1 UR AG: L. PNEUMOPHILA SEROGP 1 UR AG: NEGATIVE

## 2018-10-01 LAB — GC/CHLAMYDIA PROBE AMP (~~LOC~~) NOT AT ARMC
Chlamydia: NEGATIVE
Neisseria Gonorrhea: NEGATIVE

## 2018-10-01 LAB — PROCALCITONIN: Procalcitonin: <0.1 ng/mL

## 2018-10-01 MED ORDER — ALBUTEROL SULFATE (2.5 MG/3ML) 0.083% IN NEBU
2.5000 mg | INHALATION_SOLUTION | Freq: Once | RESPIRATORY_TRACT | Status: AC
  Administered 2018-10-01: 2.5 mg via RESPIRATORY_TRACT

## 2018-10-01 MED ORDER — ALBUTEROL SULFATE (2.5 MG/3ML) 0.083% IN NEBU: 3.0000 mL | INHALATION_SOLUTION | RESPIRATORY_TRACT | Status: DC | PRN

## 2018-10-01 MED ORDER — ALBUTEROL SULFATE HFA 108 (90 BASE) MCG/ACT IN AERS
1.0000 | INHALATION_SPRAY | RESPIRATORY_TRACT | Status: DC | PRN
  Filled 2018-10-01: qty 6.7

## 2018-10-01 MED ORDER — FLUTICASONE PROPIONATE HFA 110 MCG/ACT IN AERO: 2.0000 | INHALATION_SPRAY | Freq: Two times a day (BID) | RESPIRATORY_TRACT | 2 refills | Status: DC

## 2018-10-01 MED ORDER — GADOBUTROL 1 MMOL/ML IV SOLN
10.0000 mL | Freq: Once | INTRAVENOUS | Status: AC | PRN
  Administered 2018-10-01: 10 mL via INTRAVENOUS

## 2018-10-01 MED ORDER — AEROCHAMBER MV MISC: 0 refills | Status: AC

## 2018-10-01 MED ORDER — METHYLPREDNISOLONE SODIUM SUCC 40 MG IJ SOLR
40.0000 mg | Freq: Two times a day (BID) | INTRAMUSCULAR | Status: DC
  Administered 2018-10-01: 40 mg via INTRAVENOUS
  Filled 2018-10-01: qty 1

## 2018-10-01 MED ORDER — SODIUM CHLORIDE 0.9 % IV SOLN
2.0000 g | Freq: Two times a day (BID) | INTRAVENOUS | Status: DC
  Filled 2018-10-01: qty 20

## 2018-10-01 MED ORDER — PREDNISONE 20 MG PO TABS
40.0000 mg | ORAL_TABLET | Freq: Every day | ORAL | Status: DC
  Administered 2018-10-02: 40 mg via ORAL
  Filled 2018-10-01: qty 2

## 2018-10-01 MED ORDER — AEROCHAMBER PLUS FLO-VU MEDIUM MISC
1.0000 | Freq: Once | Status: AC
  Administered 2018-10-01: 1
  Filled 2018-10-01: qty 1

## 2018-10-01 MED ORDER — FAMOTIDINE 20 MG PO TABS
20.0000 mg | ORAL_TABLET | Freq: Two times a day (BID) | ORAL | Status: DC
  Administered 2018-10-01 – 2018-10-02 (×3): 20 mg via ORAL
  Filled 2018-10-01 (×3): qty 1

## 2018-10-01 MED ORDER — SODIUM CHLORIDE 0.9 % IV SOLN
2.0000 g | Freq: Two times a day (BID) | INTRAVENOUS | Status: DC
  Administered 2018-10-01: 2 g via INTRAVENOUS
  Filled 2018-10-01 (×2): qty 20

## 2018-10-01 NOTE — Significant Event (Signed)
Rapid Response Event Note  Overview: "Second Set of Eyes"  Initial Focused Assessment: Called by RN to assess patient for potential reaction to Vancomycin. Per RN, patient has been tachycardiac and tachypneic, is being treated for a possible CAP as well.  RN had already notified the provider on call and received orders for Benadryl 50 mg IV x 1.   Upon arrival, patient was very drowsy, endorsed that she still was itching in back, face, neck, and down her arms, + redness in those areas, facial swelling, and lips were also swollen. Lung sounds were diminished throughout, L worse than R, mild tachypnea, per RN, + DOE and increased WOB on exertion. No tongue swelling present, no stridor, and + cough. HR 120-130s ( has been) and RR 24-28 at times, 100 % on 2L, BP normal. Skin warm and dry.   I was concerned giving that the patient still ongoing facial and lip swelling and already had ongoing respiratory issues. I paged the [facility] NP an updated her. [facility] NP came and saw the patient shortly after and Neuro MD came as well for consult.   Interventions: -- Solu Medrol 40 mg IV x1 -- Pepcid 20 mg IV x 1 -- MRI STAT  Plan of Care: -- Follow up 0345 - patient improved significantly, HR in the low 100s, breathing is better, swelling has improved, and patient was able to complete MRI.  Event Summary:    at    Call Time 2043 Arrival Time 2045 End Time 2145  Dianna Deshler R

## 2018-10-01 NOTE — Care Management Note (Signed)
Case Management Note  Patient Details  Name: JONINE PEABODY MRN: [id number] Date of Birth: 07-05-1991  Subjective/Objective:   From home with family, acute resp failure, cap, she has PCP Family Medicine on [facility] in [place] with [facility]. She has an apt on 2/21 at 1 pm.                   Action/Plan: NCM will follow for transition of care needs.  Expected Discharge Date:                  Expected Discharge Plan:  Home/Self Care  In-House Referral:     Discharge planning Services  CM Consult  Post Acute Care Choice:    Choice offered to:     DME Arranged:    DME Agency:     HH Arranged:    HH Agency:     Status of Service:  Completed, signed off  If discussed at [employer] of Stay Meetings, dates discussed:    Additional Comments:  Leone Haven, RN 10/01/2018, 10:20 AM

## 2018-10-01 NOTE — Progress Notes (Signed)
NAME:  Olivia Mccall, MRN:  [id number], DOB:  02-05-1991, LOS: 3 ADMISSION DATE:  09/27/2018, CONSULTATION DATE: September 30, 2018 REFERRING MD: Dr. Sunnie Nielsen CHIEF COMPLAINT: Shortness of breath and tachypnea despite treatment in the hospital  History of present illness    Olivia Mccall 28 y.o. female -no past medical history presented and admitted on February 13-14, 2020 with worsening shortness of breath for 3 days.  In the ER found to be tachycardic to sinus of 140/min.  Also reportedly hypoxemic although I do not know the pulse ox.  CT angiogram was negative for PE or any infiltrates.  Respiratory virus panel has come back negative.  CT scan did show elevation of the right hemidiaphragm.   though the CT chest in my personal visualization did not show any obvious pneumonia signs.  She does not seem to be on steroids.  Her eosinophils on the date of admission September 27, 2018 was normal at 100 cells per cubic millimeter.  It appears that she is unimproved since she got admitted.  She did have one episode of fever 101.8 on September 28, 2018 but her white count has been normal.She is being treated for community-acquired pneumonia with ceftriaxone and azithromycin since September 28, 2018 without any relapse of fever.  Follow-up chest x-ray on September 29, 2018 shows emergence of bilateral lower lobe infiltrates consistent with pneumonia.  She feels unimproved with cough but possibly colored sputum.  She is on oxygen 2 L although when we turned it off a pulse ox was 92% on room air.  But she definitely feels short of breath with exertion and relieved at rest.  Class III dyspnea.  She has morbid obesity.  Pulmonary critical care has been consulted September 30, 2018  Respiratory virus panel September 27, 2018-  -negative HIV test September 28 2018 is negative Urine pregnancy September 28, 2018 is negative Echocardiogram September 28, 2018 is normal   Significant Hospital Events   2/14 >  admit  Consults:  September 30, 2018-pulmonary consult  Procedures:  X  Significant Diagnostic Tests:  CTA chest 2/13 > no PE or acute findings. CT chest 2/16 > bibasilar opacities c/w atx or PNA.  Trace left pleural effusion. CT head 2/16 > neg. Brain MRi 2/17 > neg Sniff test 2/17 > normal  Micro Data:  X  Antimicrobials:  September 28, 2018 ceftriaxone September 28, 2018 azithromycin  Interim history/subjective:  Less SOB, mainly only with exertion. Has some sputum production with cough. Otherwise no complaints.   Objective   Blood pressure 122/72, pulse 98, temperature 97.7 F (36.5 C), temperature source Oral, resp. rate 20, height 5\' 2"  (1.575 m), weight 107.6 kg, last menstrual period 10/01/2018, SpO2 100 %, unknown if currently breastfeeding.        Intake/Output Summary (Last 24 hours) at 10/01/2018 0947 Last data filed at 09/30/2018 2335 Gross per 24 hour  Intake 580 ml  Output 500 ml  Net 80 ml   Filed Weights   09/27/18 1349 09/27/18 2308  Weight: 108.4 kg 107.6 kg    Examination:  General: Obese female, sitting in chair watching TV HENT: [place] / AT.  MMM. Lungs: Clear bilaterally. Cardiovascular: Regular rate and rhythm. Abdomen: Obese nontender no organomegaly Extremities: No cyanosis, no clubbing, no edema Neuro: Alert and oriented x3.  Speech normal  Assessment & Plan:   Acute hypoxic respiratory failure - gradually improving.  Likely due to CAP.  Bibasilar atelectasis. - Continue supplemental O2 as needed to maintain  SpO2 > 92%. - Continue empiric CAP coverage. - IS and flutter valve. - Mobilize.  ? Asthma. - f/u on blood allergy profile and IgE. - F/u on PFT's.  ? VCD. - F/u on PFT's to assess flow volume loop.  Rest per primary team.   Rutherford Guys, PA - [facility] Pager: [phone].  If no answer, [phone] 10/01/2018, 11:47 AM

## 2018-10-01 NOTE — Progress Notes (Addendum)
NEUROLOGY PROGRESS NOTE  Subjective: Today patient states that she is feeling less short of breath, is coughing up phlegm, only feels short of breath basically when she is getting up and going to the bathroom or walking the halls.  She no longer has neck stiffness.  She does feel like she is having more energy at this point.  No longer has a headache.  Overall she states that she is definitely improved today.  Exam: Vitals:   10/01/18 0353 10/01/18 0700  BP: 124/71   Pulse: (!) 107 (!) 103  Resp: 20 (!) 30  Temp: 99.2 F (37.3 C)   SpO2: 94% 94%    Physical Exam   HEENT-  Normocephalic, no lesions, without obvious abnormality.  Normal external eye and conjunctiva.  Extremities- Warm, dry and intact Musculoskeletal-no joint tenderness, deformity or swelling Skin-warm and dry, no hyperpigmentation, vitiligo, or suspicious lesions    Neuro:  Mental Status: Alert, oriented, thought content appropriate.  Speech fluent without evidence of aphasia.  Able to follow 3 step commands without difficulty. Cranial Nerves II:  Visual fields grossly normal,  III,IV, VI: ptosis not present, extra-ocular motions intact bilaterally pupils equal, round, reactive to light and accommodation V,VII: smile symmetric, facial light touch sensation normal bilaterally VIII: hearing normal bilaterally IX,X: uvula rises midline XI: bilateral shoulder shrug XII: midline tongue extension Motor:-Neck is supple with no meningeal signs Right : Upper extremity   5/5    Left:     Upper extremity   5/5  Lower extremity   5/5     Lower extremity   5/5 Tone and bulk:normal tone throughout; no atrophy noted Sensory: Pinprick and light touch intact throughout, bilaterally Deep Tendon Reflexes: 2+ and symmetric throughout Plantars: Right: downgoing   Left: downgoing Cerebellar: normal finger-to-nose,      Medications:  Scheduled: . azithromycin  500 mg Oral Daily  . famotidine  20 mg Oral BID  . feeding  supplement (ENSURE ENLIVE)  237 mL Oral BID BM  . fluticasone  1 spray Each Nare Daily  . guaiFENesin  600 mg Oral BID  . ipratropium  0.5 mg Nebulization BID  . levalbuterol  0.63 mg Nebulization TID   Continuous: . cefTRIAXone (ROCEPHIN)  IV      Pertinent Labs/Diagnostics: No leukocytosis, mild hyponatremia, mild thrombocytopenia, microcytic anemia, low albumin and high AST with normal bilirubin. Normal pro calcitonin Negative ANA Negative viral influenza panel including coronavirus, metapneumovirus, rhinovirus, adenovirus, influenza a and B, parainfluenza 1234, RSV, and also negative Bordetella pertussis, chlamydiophila pneumonia, mycoplasma pneumonia.  Negative HIV.  Negative Monospot  Urine culture pending Allergens testing pending Legionella pneumonia pending  Ct Head Wo Contrast  Result Date: 09/30/2018 CLINICAL DATA:  Headache EXAM: CT HEAD WITHOUT CONTRAST TECHNIQUE: Contiguous axial  IMPRESSION: No acute intracranial abnormality. Electronically Signed   By: Tollie Eth M.D.   On: 09/30/2018 19:05   Ct Chest Wo Contrast  IMPRESSION: 1. Bibasilar airspace opacities consistent with atelectasis and/or pneumonia, left greater than right. Trace left pleural effusion. 2. Mild splenic enlargement though incompletely included. Electronically Signed   By: Tollie Eth M.D.   On: 09/30/2018 19:08   Mr Laqueta Jean [facility] Contrast  Result Date: 10/01/2018 . IMPRESSION: Normal brain MRI.  No acute intracranial abnormality identified. Electronically Signed   By: Rise Mu M.D.   On: 10/01/2018 04:11   Dg Sniff Test  Result Date: 10/01/2018  IMPRESSION: 1. Normal diaphragmatic motion with sniff maneuver. No findings of hemidiaphragmatic paralysis. 2.  Indistinct bibasilar airspace opacities. Electronically Signed   By: Gaylyn Rong M.D.   On: 10/01/2018 08:29        Assessment: 28 year old with no past medical history who presented with 2 weeks of not feeling well and acute  shortness of breath.  Thus far all tests have shown no significant abnormalities and MRI was normal showing no meningeal enhancement.  Patient is currently being treated for community acquired pneumonia which likely is the cause of patient's shortness of breath as she is improving with antibiotics.  Continue to have low suspicion for meningitis as noted in previous note that the neck is supple, negative Kernig's, negative Brudzinski's and no leukocytosis.   Recommendations: Continue to treat for community-acquired pneumonia At this time do not feel LP would be warranted    Felicie Morn PA-C [facility] [phone] 10/01/2018, 8:34 AM   I have seen the patient reviewed the above note.  Mental status is greatly improved, no further headache.  She is awake, alert, oriented and appropriate.  I feel that her mental status changes were likely secondary to her overall medical condition.  At this time, no further recommendations, please call with further questions or concerns.  Ritta Slot, MD [facility] [phone]  If 7pm- 7am, please page neurology on call as listed in AMION.

## 2018-10-01 NOTE — Evaluation (Signed)
Physical Therapy Evaluation Patient Details Name: RUCHIKA ESHBAUGH MRN: [id number] DOB: 05-17-1991 Today's Date: 10/01/2018   History of Present Illness  Pt is a 28 y.o. female who presented to the ED with c/o SOB. Pt admitted for acute respiratory failure with hypoxia due to CAP.     Clinical Impression  Pt admitted with above diagnosis. Pt currently with functional limitations due to the deficits listed below (see PT Problem List). PTA pt independent. On eval, she required supervision transfers and min guard assist ambulation 200 feet without AD. SpO2 > 90% during mobility on RA. Pt will benefit from skilled PT to increase their independence and safety with mobility to allow discharge to the venue listed below.  PT to follow acutely. No follow up services indicated.     Follow Up Recommendations No PT follow up;Supervision - Intermittent    Equipment Recommendations  None recommended by PT    Recommendations for Other Services       Precautions / Restrictions Precautions Precautions: Other (comment) Precaution Comments: watch sats      Mobility  Bed Mobility Overal bed mobility: Modified Independent                Transfers Overall transfer level: Needs assistance Equipment used: None Transfers: Sit to/from Stand;Stand Pivot Transfers Sit to Stand: Supervision Stand pivot transfers: Supervision       General transfer comment: supervision for safety  Ambulation/Gait Ambulation/Gait assistance: Min guard Gait Distance (Feet): 200 Feet Assistive device: None Gait Pattern/deviations: Step-through pattern;Decreased stride length Gait velocity: decreased Gait velocity interpretation: <1.31 ft/sec, indicative of household ambulator General Gait Details: slow, steady gait. Pt required one standing rest break after ambulating 75 feet. Pt instructed in pursed lip breathing and to take deeper breaths. SpO2 > 90% on RA (ranging 91-96% during ambulation). Max HR 120  during ambulation HR 98 at rest in recliner.   Stairs            Wheelchair Mobility    Modified Rankin (Stroke Patients Only)       Balance Overall balance assessment: No apparent balance deficits (not formally assessed)                                           Pertinent Vitals/Pain Pain Assessment: No/denies pain    Home Living Family/patient expects to be discharged to:: Private residence Living Arrangements: Parent;Children Available Help at Discharge: Family Type of Home: House Home Access: Level entry     Home Layout: Able to live on main level with bedroom/bathroom Home Equipment: None      Prior Function Level of Independence: Independent               Hand Dominance        Extremity/Trunk Assessment   Upper Extremity Assessment Upper Extremity Assessment: Generalized weakness    Lower Extremity Assessment Lower Extremity Assessment: Generalized weakness    Cervical / Trunk Assessment Cervical / Trunk Assessment: Normal  Communication   Communication: No difficulties  Cognition Arousal/Alertness: Awake/alert Behavior During Therapy: WFL for tasks assessed/performed Overall Cognitive Status: Within Functional Limits for tasks assessed                                        General Comments  Exercises     Assessment/Plan    PT Assessment Patient needs continued PT services  PT Problem List Decreased strength;Decreased balance;Decreased mobility;Cardiopulmonary status limiting activity;Decreased activity tolerance       PT Treatment Interventions Functional mobility training;Balance training;Patient/family education;Gait training;Therapeutic activities;Stair training;Therapeutic exercise    PT Goals (Current goals can be found in the Care Plan section)  Acute Rehab PT Goals Patient Stated Goal: home PT Goal Formulation: With patient Time For Goal Achievement: 10/15/18 Potential to  Achieve Goals: Good    Frequency Min 3X/week   Barriers to discharge        Co-evaluation               AM-PAC PT "6 Clicks" Mobility  Outcome Measure Help needed turning from your back to your side while in a flat bed without using bedrails?: None Help needed moving from lying on your back to sitting on the side of a flat bed without using bedrails?: None Help needed moving to and from a bed to a chair (including a wheelchair)?: None Help needed standing up from a chair using your arms (e.g., wheelchair or bedside chair)?: None Help needed to walk in hospital room?: A Little Help needed climbing 3-5 steps with a railing? : A Little 6 Click Score: 22    End of Session   Activity Tolerance: Patient tolerated treatment well Patient left: in chair;with call bell/phone within reach;with family/visitor present Nurse Communication: Mobility status PT Visit Diagnosis: Difficulty in walking, not elsewhere classified (R26.2)    Time: 1051-1110 PT Time Calculation (min) (ACUTE ONLY): 19 min   Charges:   PT Evaluation $PT Eval Low Complexity: 1 Low          Aida Raider, PT  Office # [phone] Pager [phone]   Ilda Foil 10/01/2018, 1:23 PM

## 2018-10-01 NOTE — Progress Notes (Signed)
PROGRESS NOTE    Olivia ChurchLapria A Mccall  ZOX:[id number] DOB: 10-Mar-1991 DOA: 09/27/2018 PCP: Patient, No Pcp Per    Brief Narrative: [age] year old with no significant past medical history who presents to the emergency department complaining of shortness of breath worse on exertion for the last 3 days.  Patient has been having also some epigastric discomfort.  In the ER patient was noted to be tachycardic tachypneic.  CT angios was negative for PE.  Show some elevation of the right hemi-diaphragma.  Influenza panel negative respiratory viral panel negative.   Assessment & Plan:   Principal Problem:   Acute respiratory failure with hypoxia (HCC)  Acute, hypoxic tachypnea respiratory failure; dyspnea; secondary to community-acquired pneumonia -CT angios negative for PE. Respiratory panel negative. -Continue with schedule nebulizer. -ECHO;normal ejection fraction no diastolic dysfunction -ANA.  Negative. HIV negative -Repeated chest x-Olivia today after hydration showed bilateral lower lobe infiltrates consistent with pneumonia. -Continue with ceftriaxone and azithromycin day 4.  Lactic acid normal BP stable no evidence of sepsis.  Blood cultures.. no growth to date. Sputum culture Patient continue to have SOB, exertion and talking. RR fluctuates 20---40.  CT chest confirm PNA> SNF test normal.  Felt improvement after solumedrol given  for allergic reaction. I have started low dose solumedrol.  Appreciate pulmonologist evaluation.   Allergic reaction; develops rash, hot feeling and  Lips swelling after vancomycin.  Vancomycin discontinue.  Agree with Pepcid, continue. Start schedule solumedrol.  Benadryl PRN.    Sinus Tachycardia: Suspect multifactorial related to fever: Acute infection. PNA Received IV fluids. TSH mildly elevated.  free T3 free T4 normal. Echo normal.  Fever:suspect related to pneumonia chest x-Olivia today showed bilateral infiltrates. Patient has frontal headache, and neck  pain.  She does not have any nuchal rigidity.  She is alert and oriented.  Now we have a reason for her fever with a chest x-Olivia positive for pneumonia..  Neurology consulted. No need for LP.   Headache:Tylenol, tramadol.  Started  Flonase in case of some sinus congestion.  Her headache is frontal area Headache and neck pain is better.  Resolved.   Acute metabolic encephalopathy.  Appreciate neurology assistance.  No evidence of meningitis.  MRI negative.  Improved to day.   Estimated body mass index is 43.39 kg/m as calculated from the following:   Height as of this encounter: 5\' 2"  (1.575 m).   Weight as of this encounter: 107.6 kg.   DVT prophylaxis: scd Code Status: full code Family Communication: care discussed with patient, and  mother over the phone Disposition Plan: (remain in the hospital for Treatment of resp failure  Consultants:   none   Procedures:   ECHO   Antimicrobials:   Ceftriaxone, azithro   Subjective: Dyspnea improved. She is feeling better today. IV solumedrol help her.  Appetite is back.    Objective: Vitals:   09/30/18 2114 09/30/18 2257 10/01/18 0353 10/01/18 0700  BP:  139/75 124/71   Pulse:  (!) 114 (!) 107 (!) 103  Resp:  18 20 (!) 30  Temp: 99.9 F (37.7 C) 99.9 F (37.7 C) 99.2 F (37.3 C)   TempSrc: Oral Oral Oral   SpO2:  95% 94% 94%  Weight:      Height:        Intake/Output Summary (Last 24 hours) at 10/01/2018 0844 Last data filed at 09/30/2018 2335 Gross per 24 hour  Intake 580 ml  Output 500 ml  Net 80 ml   [employer]iled [employer]ights  09/27/18 1349 09/27/18 2308  Weight: 108.4 kg 107.6 kg    Examination:  General exam: NAD Respiratory system: Bilateral crackles.  Cardiovascular system: S 1, S 2 RRR Gastrointestinal system: BS present, soft, nt Central nervous system: Non focal.  Extremities: simmetric power.  Skin: no rashes.   Data Reviewed: I have personally reviewed following labs and imaging  studies  CBC: Recent Labs  Lab 09/27/18 1611 09/28/18 0654 09/29/18 0311 09/30/18 0859  WBC 7.2 6.1 7.5 8.1  NEUTROABS 1.8  --   --   --   HGB 12.9 11.6* 11.8* 11.7*  HCT 42.6 36.3 38.5 38.2  MCV 79.6* 77.2* 78.1* 78.0*  PLT 166 149* 153 147*   Basic Metabolic Panel: Recent Labs  Lab 09/27/18 1611 09/28/18 0654 09/29/18 0311 09/30/18 0859  NA 135 137 135 134*  K 4.1 3.8 4.1 4.0  CL 102 109 102 104  CO2 26 22 21* 21*  GLUCOSE 122* 98 116* 90  BUN 7 <5* <5* 5*  CREATININE 1.02* 0.90 0.98 0.82  CALCIUM 9.1 8.1* 8.4* 8.1*   GFR: Estimated Creatinine Clearance: 118.9 mL/min (by C-G formula based on SCr of 0.82 mg/dL). Liver Function Tests: Recent Labs  Lab 09/28/18 0215  AST 42*  ALT 43  ALKPHOS 41  BILITOT 0.5  PROT 6.8  ALBUMIN 2.7*   Recent Labs  Lab 09/28/18 0654  LIPASE 24   No results for input(s): AMMONIA in the last 168 hours. Coagulation Profile: No results for input(s): INR, PROTIME in the last 168 hours. Cardiac Enzymes: Recent Labs  Lab 09/28/18 0215 09/28/18 0654 09/28/18 1248  CKTOTAL 90  --   --   TROPONINI <0.03 <0.03 <0.03   BNP (last 3 results) No results for input(s): PROBNP in the last 8760 hours. HbA1C: No results for input(s): HGBA1C in the last 72 hours. CBG: Recent Labs  Lab 09/30/18 2118  GLUCAP 105*   Lipid Profile: No results for input(s): CHOL, HDL, LDLCALC, TRIG, CHOLHDL, LDLDIRECT in the last 72 hours. Thyroid Function Tests: Recent Labs    09/28/18 1248  FREET4 0.87   Anemia Panel: No results for input(s): VITAMINB12, FOLATE, FERRITIN, TIBC, IRON, RETICCTPCT in the last 72 hours. Sepsis Labs: Recent Labs  Lab 09/30/18 0859 09/30/18 1201 09/30/18 1600  PROCALCITON  --   --  <0.10  LATICACIDVEN 0.9 0.9  --     Recent Results (from the past 240 hour(s))  Respiratory Panel by PCR     Status: None   Collection Time: 09/27/18  8:03 PM  Result Value Ref Range Status   Adenovirus NOT DETECTED NOT  DETECTED Final   Coronavirus 229E NOT DETECTED NOT DETECTED Final    Comment: (NOTE) The Coronavirus on the Respiratory Panel, DOES NOT test for the novel  Coronavirus (2019 nCoV)    Coronavirus HKU1 NOT DETECTED NOT DETECTED Final   Coronavirus NL63 NOT DETECTED NOT DETECTED Final   Coronavirus OC43 NOT DETECTED NOT DETECTED Final   Metapneumovirus NOT DETECTED NOT DETECTED Final   Rhinovirus / Enterovirus NOT DETECTED NOT DETECTED Final   Influenza A NOT DETECTED NOT DETECTED Final   Influenza B NOT DETECTED NOT DETECTED Final   Parainfluenza Virus 1 NOT DETECTED NOT DETECTED Final   Parainfluenza Virus 2 NOT DETECTED NOT DETECTED Final   Parainfluenza Virus 3 NOT DETECTED NOT DETECTED Final   Parainfluenza Virus 4 NOT DETECTED NOT DETECTED Final   Respiratory Syncytial Virus NOT DETECTED NOT DETECTED Final   Bordetella pertussis NOT DETECTED  NOT DETECTED Final   Chlamydophila pneumoniae NOT DETECTED NOT DETECTED Final   Mycoplasma pneumoniae NOT DETECTED NOT DETECTED Final    Comment: Performed at [facility], [address] [place], [place] [address]  Expectorated sputum assessment w rflx to resp cult     Status: None   Collection Time: 09/29/18 10:38 AM  Result Value Ref Range Status   Specimen Description SPU  Final   Special Requests NONE  Final   Sputum evaluation   Final    Sputum specimen not acceptable for testing.  Please recollect.   Gram Stain Report Called to,Read Back By and Verified With: M BROOKS,RN AT 1945 09/29/2018 BY L BENFIELD Performed at [facility], [address], [place], [place] [address]    Report Status 09/29/2018 FINAL  Final  Culture, blood (routine x 2)     Status: None (Preliminary result)   Collection Time: 09/29/18 12:26 PM  Result Value Ref Range Status   Specimen Description BLOOD LEFT HAND  Final   Special Requests   Final    BOTTLES DRAWN AEROBIC ONLY Blood Culture adequate volume   Culture   Final    NO GROWTH 1  DAY Performed at [facility], [address], [place], [place] [address]    Report Status PENDING  Incomplete  Culture, blood (routine x 2)     Status: None (Preliminary result)   Collection Time: 09/29/18 12:32 PM  Result Value Ref Range Status   Specimen Description BLOOD RIGHT HAND  Final   Special Requests   Final    BOTTLES DRAWN AEROBIC ONLY Blood Culture adequate volume   Culture   Final    NO GROWTH 1 DAY Performed at [facility], [address] [place], [place] [address]    Report Status PENDING  Incomplete  MRSA PCR Screening     Status: None   Collection Time: 09/30/18 10:27 AM  Result Value Ref Range Status   MRSA by PCR NEGATIVE NEGATIVE Final    Comment:        The GeneXpert MRSA Assay (FDA approved for NASAL specimens only), is one component of a comprehensive MRSA colonization surveillance program. It is not intended to diagnose MRSA infection nor to guide or monitor treatment for MRSA infections. Performed at [facility], [address] [place], [place] [address]   Expectorated sputum assessment w rflx to resp cult     Status: None   Collection Time: 09/30/18  3:20 PM  Result Value Ref Range Status   Specimen Description EXPECTORATED SPUTUM  Final   Special Requests Normal  Final   Sputum evaluation   Final    Sputum specimen not acceptable for testing.  Please recollect.   Gram Stain Report Called to,Read Back By and Verified With: M REED,RN AT 1746 09/30/2018 BY L BENFIELD Performed at [facility], [address] [place], [place] [address]    Report Status 09/30/2018 FINAL  Final         Radiology Studies: Dg Chest 2 View  Result Date: 09/29/2018 CLINICAL DATA:  Shortness of breath with exertion over the last 3 days. Cough. EXAM: CHEST - 2 VIEW COMPARISON:  CT 2 days ago. FINDINGS: Cardiomediastinal silhouette is normal. There is patchy bronchopneumonia in both lower lobes. No dense consolidation or lobar collapse.  Tiny amount of fluid in the posterior costophrenic angles. IMPRESSION: Patchy bilateral lower lobe bronchopneumonia. Electronically Signed   By: Loraine Leriche  Shogry M.D.   On: 09/29/2018 10:35   Ct Head Wo Contrast  Result Date: 09/30/2018 CLINICAL DATA:  Headache EXAM: CT HEAD WITHOUT CONTRAST TECHNIQUE: Contiguous axial images were obtained from the base of the skull through the vertex without intravenous contrast. COMPARISON:  Report from 08/18/2005 FINDINGS: BRAIN: The ventricles and sulci are normal. No intraparenchymal hemorrhage, mass effect nor midline shift. No acute large vascular territory infarcts. Grey-white matter distinction is maintained. The basal ganglia are unremarkable. No abnormal extra-axial fluid collections. Basal cisterns are not effaced and midline. The brainstem and cerebellar hemispheres are without acute abnormalities. Stable calcification projecting off the inner table of the right parietal skull overlying the right parietal lobe. No underlying mass effect. Given stability since 2007, this suggests a benign finding possibly a calcified meningioma or remote posttraumatic change. VASCULAR: Unremarkable. SKULL/SOFT TISSUES: No skull fracture. No significant soft tissue swelling. ORBITS/SINUSES: The included ocular globes and orbital contents are normal.The mastoid air cells are clear. The included paranasal sinuses are well-aerated. OTHER: None. IMPRESSION: No acute intracranial abnormality. Electronically Signed   By: Tollie Eth M.D.   On: 09/30/2018 19:05   Ct Chest Wo Contrast  Result Date: 09/30/2018 CLINICAL DATA:  Acute respiratory illness EXAM: CT CHEST WITHOUT CONTRAST TECHNIQUE: Multidetector CT imaging of the chest was performed following the standard protocol without IV contrast. COMPARISON:  09/30/2018 CXR and 09/27/2018 CT FINDINGS: Cardiovascular: Normal heart size without pericardial effusion. Trace amount of fluid in the pericardial recesses. The aorta is normal in  caliber. The unenhanced pulmonary vasculature is unremarkable. Great vessels are nonacute. Mediastinum/Nodes: No enlarged mediastinal or axillary lymph nodes. Thyroid gland, trachea, and esophagus demonstrate no significant findings. Lungs/Pleura: Limited by motion artifact. Bibasilar airspace opacities consistent with atelectasis and/or pneumonia are identified in the right middle lobe, lingula and both lower lobes, left greater than right. Trace left effusion. No pneumothorax or dominant mass. Upper Abdomen: Generous sized spleen the extent included measuring up to 13 cm AP. Otherwise unremarkable images through the upper abdomen. Musculoskeletal: No chest wall mass or suspicious bone lesions identified. IMPRESSION: 1. Bibasilar airspace opacities consistent with atelectasis and/or pneumonia, left greater than right. Trace left pleural effusion. 2. Mild splenic enlargement though incompletely included. Electronically Signed   By: Tollie Eth M.D.   On: 09/30/2018 19:08   Mr Laqueta Jean ZO Contrast  Result Date: 10/01/2018 CLINICAL DATA:  INITIAL EVALUATION FOR ACUTE ALTERED MENTAL STATUS. EXAM: MRI HEAD WITHOUT AND WITH CONTRAST TECHNIQUE: Multiplanar, multiecho pulse sequences of the brain and surrounding structures were obtained without and with intravenous contrast. CONTRAST:  10 CC OF GADAVIST. COMPARISON:  Prior CT from 09/30/2018. FINDINGS: Brain: Cerebral volume within normal limits for patient age. No focal parenchymal signal abnormality identified. No abnormal foci of restricted diffusion to suggest acute or subacute ischemia. Minimal diffusion abnormality at the right parietal convexity most consistent with susceptibility artifact related to adjacent calvarial exostosis. Gray-white matter differentiation well maintained. No encephalomalacia to suggest chronic infarction. No foci of susceptibility artifact to suggest acute or chronic intracranial hemorrhage. No mass lesion, midline shift or mass effect.  No hydrocephalus. No extra-axial fluid collection. Major dural sinuses are grossly patent. Pituitary gland and suprasellar region are normal. Midline structures intact and normal. No abnormal enhancement. Vascular: Major intracranial vascular flow voids well maintained and normal in appearance. Skull and upper cervical spine: Craniocervical junction normal. Visualized upper cervical spine within normal limits. Bone marrow signal intensity normal. No scalp soft tissue abnormality. Sinuses/Orbits: Globes and orbital soft  tissues within normal limits. Scattered mucosal thickening throughout the paranasal sinuses. No air-fluid levels to suggest acute sinusitis. Trace opacity bilateral mastoid air cells. Inner ear structures normal. Other: None. IMPRESSION: Normal brain MRI.  No acute intracranial abnormality identified. Electronically Signed   By: Rise Mu M.D.   On: 10/01/2018 04:11   Dg Sniff Test  Result Date: 10/01/2018 CLINICAL DATA:  Shortness of breath and elevated diaphragm EXAM: CHEST FLUOROSCOPY TECHNIQUE: Real-time fluoroscopic evaluation of the chest was performed. FLUOROSCOPY TIME:  Fluoroscopy Time:  0 minutes, 24 seconds Radiation Exposure Index (if provided by the fluoroscopic device): 2.6 mGy Number of Acquired Spot Images: 0 COMPARISON:  Chest CT 09/30/2018 FINDINGS: Indistinct opacities at the lung bases. The patient had coughing during today's exam. The patient perform several sniff maneuvers. All demonstrate simultaneous and appropriate motion of the hemidiaphragms, without findings of hemidiaphragmatic paralysis. IMPRESSION: 1. Normal diaphragmatic motion with sniff maneuver. No findings of hemidiaphragmatic paralysis. 2. Indistinct bibasilar airspace opacities. Electronically Signed   By: Gaylyn Rong M.D.   On: 10/01/2018 08:29   Dg Chest Port 1 View  Result Date: 09/30/2018 CLINICAL DATA:  Shortness of breath EXAM: PORTABLE CHEST 1 VIEW COMPARISON:  September 29, 2018  FINDINGS: Mild bibasilar opacities, similar in the interval. The heart, hila, mediastinum, lungs, and pleura are otherwise unremarkable. IMPRESSION: Mild bibasilar opacities, suspicious for pneumonia, are stable. Electronically Signed   By: Gerome Sam III M.D   On: 09/30/2018 15:17        Scheduled Meds: . azithromycin  500 mg Oral Daily  . famotidine  20 mg Oral BID  . feeding supplement (ENSURE ENLIVE)  237 mL Oral BID BM  . fluticasone  1 spray Each Nare Daily  . guaiFENesin  600 mg Oral BID  . ipratropium  0.5 mg Nebulization BID  . levalbuterol  0.63 mg Nebulization TID   Continuous Infusions: . cefTRIAXone (ROCEPHIN)  IV       LOS: 3 days    Time spent: 35 minutes.     Alba Cory, MD [facility]  10/01/2018, 8:44 AM

## 2018-10-02 LAB — PROCALCITONIN: Procalcitonin: <0.1 ng/mL

## 2018-10-02 MED ORDER — PREDNISONE 20 MG PO TABS: 40.0000 mg | ORAL_TABLET | Freq: Every day | ORAL | 0 refills | Status: AC

## 2018-10-02 MED ORDER — AZITHROMYCIN 500 MG PO TABS: 500.0000 mg | ORAL_TABLET | Freq: Every day | ORAL | 0 refills | Status: DC

## 2018-10-02 MED ORDER — FAMOTIDINE 20 MG PO TABS: 20.0000 mg | ORAL_TABLET | Freq: Two times a day (BID) | ORAL | 0 refills | Status: DC

## 2018-10-02 MED ORDER — GUAIFENESIN ER 600 MG PO TB12: 600.0000 mg | ORAL_TABLET | Freq: Two times a day (BID) | ORAL | 0 refills | Status: DC

## 2018-10-02 NOTE — Progress Notes (Signed)
Physical Therapy Treatment and Discharge  Patient Details Name: Olivia Mccall MRN: [id number] DOB: 12-03-1990 Today's Date: 10/02/2018    History of Present Illness Pt is a 28 y.o. female who presented to the ED with c/o SOB. Pt admitted for acute respiratory failure with hypoxia due to CAP.     PT Comments    Pt significantly improved with ability to ambulate 500' and ascend and descend flight of stairs. SpO2 maintained mid 90's except 88% after stairs with 2 mins to return to 90's. HR low 100's except 118 bpm with stairs. Discussed resuming daily activities as well as exercise and general health once recovered. Pt has met acute PT goals, d/c from PT.    Follow Up Recommendations  No PT follow up;Supervision - Intermittent     Equipment Recommendations  None recommended by PT    Recommendations for Other Services       Precautions / Restrictions Precautions Precautions: Other (comment) Precaution Comments: watch sats Restrictions Weight Bearing Restrictions: No    Mobility  Bed Mobility Overal bed mobility: Modified Independent                Transfers Overall transfer level: Modified independent Equipment used: None Transfers: Sit to/from Stand Sit to Stand: Modified independent (Device/Increase time)         General transfer comment: pt stood safely from bed with good pace and timing  Ambulation/Gait Ambulation/Gait assistance: Modified independent (Device/Increase time) Gait Distance (Feet): 500 Feet Assistive device: None Gait Pattern/deviations: Step-through pattern Gait velocity: decreased Gait velocity interpretation: >2.62 ft/sec, indicative of community ambulatory General Gait Details: worked on increasing pace as well as starting and stopping. O2 sats remained mid 90's, HR low 100's   Stairs Stairs: Yes Stairs assistance: Supervision Stair Management: One rail Right;Alternating pattern;Forwards Number of Stairs: 10 General stair  comments: O2 sats dropped to 88% after stairs but quickly returned to 90's with standing rest. HR up to 118 bpm. No LOB or strength issues on stairs   Wheelchair Mobility    Modified Rankin (Stroke Patients Only)       Balance Overall balance assessment: No apparent balance deficits (not formally assessed)                                          Cognition Arousal/Alertness: Awake/alert Behavior During Therapy: WFL for tasks assessed/performed Overall Cognitive Status: Within Functional Limits for tasks assessed                                        Exercises      General Comments General comments (skin integrity, edema, etc.): discussed general health as she heals inclusing exercise and healthy diet      Pertinent Vitals/Pain Pain Assessment: No/denies pain    Home Living                      Prior Function            PT Goals (current goals can now be found in the care plan section) Acute Rehab PT Goals Patient Stated Goal: home PT Goal Formulation: With patient Time For Goal Achievement: 10/15/18 Potential to Achieve Goals: Good Progress towards PT goals: Goals met/education completed, patient discharged from PT    Frequency  Min 3X/week      PT Plan Current plan remains appropriate    Co-evaluation              AM-PAC PT "6 Clicks" Mobility   Outcome Measure  Help needed turning from your back to your side while in a flat bed without using bedrails?: None Help needed moving from lying on your back to sitting on the side of a flat bed without using bedrails?: None Help needed moving to and from a bed to a chair (including a wheelchair)?: None Help needed standing up from a chair using your arms (e.g., wheelchair or bedside chair)?: None Help needed to walk in hospital room?: None Help needed climbing 3-5 steps with a railing? : None 6 Click Score: 24    End of Session   Activity Tolerance:  Patient tolerated treatment well Patient left: in chair;with call bell/phone within reach;with family/visitor present Nurse Communication: Mobility status PT Visit Diagnosis: Difficulty in walking, not elsewhere classified (R26.2)     Time: 4037-0964 PT Time Calculation (min) (ACUTE ONLY): 23 min  Charges:  $Gait Training: 23-37 mins                     Leighton Roach, [place]  Pager [phone] Office [place] 10/02/2018, 12:04 PM

## 2018-10-02 NOTE — Discharge Summary (Signed)
Physician Discharge Summary  Olivia Mccall BLT:[id number] DOB: 1991-05-30 DOA: 09/27/2018  PCP: Patient, No Pcp Per  Admit date: 09/27/2018 Discharge date: 10/03/2018  Admitted From: Home  Disposition:  Home   Recommendations for Outpatient Follow-up:  1. Follow up with PCP in 1-2 weeks 2. Please obtain BMP/CBC in one week 3. Needs follow up with Pulmonologist to have PFT repeated.    Home Health: none  Discharge Condition: Stable.  CODE STATUS: Full code.  Diet recommendation: Heart Healthy   Brief/Interim Summary: 28 year old with no significant past medical history who presents to the emergency department complaining of shortness of breath worse on exertion for the last 3 days.  Patient has been having also some epigastric discomfort.  In the ER patient was noted to be tachycardic tachypneic.  CT angios was negative for PE.  Show some elevation of the right hemi-diaphragma.  Influenza panel negative respiratory viral panel negative.  Acute, hypoxic tachypnea respiratory failure; dyspnea; secondary to community-acquired pneumonia and possible Asthma, new diagnosis.  -CT angios negative for PE. Respiratory panel negative. -Continue with schedule nebulizer. -ECHO;normal ejection fraction no diastolic dysfunction -ANA.  Negative. HIV negative -Repeated chest x-ray today after hydration showed bilateral lower lobe infiltrates consistent with pneumonia. -Treated  with ceftriaxone and azithromycin day 4.  Lactic acid normal BP stable no evidence of sepsis.  Blood cultures.. no growth to date. Sputum culture Patient continue to have SOB, exertion and talking. RR fluctuates 20---40.  CT chest confirm PNA> SNF test normal.  Felt improvement after solumedrol given  for allergic reaction. I have started low dose solumedrol.  Appreciate pulmonologist evaluation.  PFT consistent with obstructive patter, severe. Pulmonologist recommend repeat PFT when patient recover from PNA to have  accurate test. Discharge on fluticasone and Albuterol.   Anaphylaxis to vancomycin. Allergic reaction; develops rash, hot feeling and  Lips swelling after vancomycin.  Vancomycin discontinue.  Treated  with Pepcid, IV  solumedrol.  Benadryl PRN.    Sinus Tachycardia: Suspect multifactorial related to fever: Acute infection. PNA Received IV fluids. TSH mildly elevated.  free T3 free T4 normal. Echo normal.  Fever:suspect related to pneumonia chest x-ray today showed bilateral infiltrates. Patient has frontal headache, and neck pain.  She does not have any nuchal rigidity.  She is alert and oriented.  Now we have a reason for her fever with a chest x-ray positive for pneumonia..  Neurology consulted. No need for LP.   Headache:Tylenol, tramadol.  Started  Flonase in case of some sinus congestion.  Her headache is frontal area Headache and neck pain is better. Neurology consulted, family request for evaluation of meningitis. Resolved.   Acute metabolic encephalopathy.  Appreciate neurology assistance.  No evidence of meningitis.  MRI negative.  Resolved.   Obesity  Estimated body mass index is 43.39 kg/m as calculated from the following:   Height as of this encounter: 5\' 2"  (1.575 m).   Weight as of this encounter: 107.6 kg.   Discharge Diagnoses:    Acute respiratory failure with hypoxia (HCC)  PNA,Community Acquired.  Asthma. Acute Metabolic Encephalopathy.  Obesity Anaphylaxis to vancomycin.   Discharge Instructions  Discharge Instructions    Diet - low sodium heart healthy   Complete by:  As directed    Increase activity slowly   Complete by:  As directed      Allergies as of 10/02/2018      Reactions   Vancomycin    Lips swelling, generalized rash, felt  hot.  Medication List    TAKE these medications   AEROCHAMBER MV inhaler Use as instructed   azithromycin 500 MG tablet Commonly known as:  ZITHROMAX Take 1 tablet (500 mg total) by mouth  daily.   famotidine 20 MG tablet Commonly known as:  PEPCID Take 1 tablet (20 mg total) by mouth 2 (two) times daily.   fluticasone 110 MCG/ACT inhaler Commonly known as:  FLOVENT HFA Inhale 2 puffs into the lungs 2 (two) times daily.   guaiFENesin 600 MG 12 hr tablet Commonly known as:  MUCINEX Take 1 tablet (600 mg total) by mouth 2 (two) times daily.   predniSONE 20 MG tablet Commonly known as:  DELTASONE Take 2 tablets (40 mg total) by mouth daily with breakfast for 3 days.   TAYTULLA 1-20 MG-MCG(24) Caps Generic drug:  Norethin Ace-Eth Estrad-FE Take 1 tablet by mouth daily.      Follow-up Information    Luciano CutterEllison, Chi Jane, MD. Schedule an appointment as soon as possible for a visit in 5 week(s).   Specialty:  Pulmonary Disease Why:  PFT's prior to visit Contact information: [address] [place] [address] [phone]          Allergies  Allergen Reactions  . Vancomycin     Lips swelling, generalized rash, felt  hot.     Consultations:  Pulmonologist  Neurology   Procedures/Studies: Dg Chest 2 View  Result Date: 09/29/2018 CLINICAL DATA:  Shortness of breath with exertion over the last 3 days. Cough. EXAM: CHEST - 2 VIEW COMPARISON:  CT 2 days ago. FINDINGS: Cardiomediastinal silhouette is normal. There is patchy bronchopneumonia in both lower lobes. No dense consolidation or lobar collapse. Tiny amount of fluid in the posterior costophrenic angles. IMPRESSION: Patchy bilateral lower lobe bronchopneumonia. Electronically Signed   By: Paulina FusiMark  Shogry M.D.   On: 09/29/2018 10:35   Ct Head Wo Contrast  Result Date: 09/30/2018 CLINICAL DATA:  Headache EXAM: CT HEAD WITHOUT CONTRAST TECHNIQUE: Contiguous axial images were obtained from the base of the skull through the vertex without intravenous contrast. COMPARISON:  Report from 08/18/2005 FINDINGS: BRAIN: The ventricles and sulci are normal. No intraparenchymal hemorrhage, mass effect nor midline  shift. No acute large vascular territory infarcts. Grey-white matter distinction is maintained. The basal ganglia are unremarkable. No abnormal extra-axial fluid collections. Basal cisterns are not effaced and midline. The brainstem and cerebellar hemispheres are without acute abnormalities. Stable calcification projecting off the inner table of the right parietal skull overlying the right parietal lobe. No underlying mass effect. Given stability since 2007, this suggests a benign finding possibly a calcified meningioma or remote posttraumatic change. VASCULAR: Unremarkable. SKULL/SOFT TISSUES: No skull fracture. No significant soft tissue swelling. ORBITS/SINUSES: The included ocular globes and orbital contents are normal.The mastoid air cells are clear. The included paranasal sinuses are well-aerated. OTHER: None. IMPRESSION: No acute intracranial abnormality. Electronically Signed   By: Tollie Ethavid  Kwon M.D.   On: 09/30/2018 19:05   Ct Chest Wo Contrast  Result Date: 09/30/2018 CLINICAL DATA:  Acute respiratory illness EXAM: CT CHEST WITHOUT CONTRAST TECHNIQUE: Multidetector CT imaging of the chest was performed following the standard protocol without IV contrast. COMPARISON:  09/30/2018 CXR and 09/27/2018 CT FINDINGS: Cardiovascular: Normal heart size without pericardial effusion. Trace amount of fluid in the pericardial recesses. The aorta is normal in caliber. The unenhanced pulmonary vasculature is unremarkable. Great vessels are nonacute. Mediastinum/Nodes: No enlarged mediastinal or axillary lymph nodes. Thyroid gland, trachea, and esophagus demonstrate no  significant findings. Lungs/Pleura: Limited by motion artifact. Bibasilar airspace opacities consistent with atelectasis and/or pneumonia are identified in the right middle lobe, lingula and both lower lobes, left greater than right. Trace left effusion. No pneumothorax or dominant mass. Upper Abdomen: Generous sized spleen the extent included measuring up  to 13 cm AP. Otherwise unremarkable images through the upper abdomen. Musculoskeletal: No chest wall mass or suspicious bone lesions identified. IMPRESSION: 1. Bibasilar airspace opacities consistent with atelectasis and/or pneumonia, left greater than right. Trace left pleural effusion. 2. Mild splenic enlargement though incompletely included. Electronically Signed   By: Tollie Eth M.D.   On: 09/30/2018 19:08   Ct Angio Chest Pe W/cm &/or Wo Cm  Result Date: 09/27/2018 CLINICAL DATA:  Three day history of cough, shortness of breath and chest pain. EXAM: CT ANGIOGRAPHY CHEST WITH CONTRAST TECHNIQUE: Multidetector CT imaging of the chest was performed using the standard protocol during bolus administration of intravenous contrast. Multiplanar CT image reconstructions and MIPs were obtained to evaluate the vascular anatomy. CONTRAST:  ISOVUE-370 IOPAMIDOL (ISOVUE-370) INJECTION 76% COMPARISON:  None. FINDINGS: Cardiovascular: The heart is normal in size. No pericardial effusion. Small amount of fluid in the pericardial recesses. The aorta is normal in caliber. No dissection. No atherosclerotic calcifications. The branch vessels are patent. The pulmonary arterial tree is fairly well opacified. No filling defects to suggest pulmonary embolism. Mediastinum/Nodes: No mediastinal or hilar mass or lymphadenopathy. Small scattered lymph nodes are noted. The esophagus is grossly normal. Lungs/Pleura: Moderate eventration of the right hemidiaphragm with overlying vascular crowding and streaky atelectasis. No infiltrates, edema or effusions. There are few scattered small subpleural nodules which are likely lymph nodes. No worrisome pulmonary lesions. Upper Abdomen: No significant upper abdominal findings. The spleen is only partially visualized but measures 13 x 9.5 cm. Could not exclude mild splenomegaly. Musculoskeletal: No breast masses, supraclavicular or axillary lymphadenopathy. Small scattered lymph nodes are  noted. The thyroid gland is grossly normal. No significant bony findings. Review of the MIP images confirms the above findings. IMPRESSION: 1. No CT findings for pulmonary embolism. 2. Normal thoracic aorta. 3. No acute pulmonary findings. 4. Eventration of the right hemidiaphragm with overlying vascular crowding and streaky atelectasis. 5. Possible splenomegaly. Limited ultrasound examination may be helpful for more accurate measurement. Electronically Signed   By: Rudie Meyer M.D.   On: 09/27/2018 17:09   Mr Laqueta Jean OA Contrast  Result Date: 10/01/2018 CLINICAL DATA:  INITIAL EVALUATION FOR ACUTE ALTERED MENTAL STATUS. EXAM: MRI HEAD WITHOUT AND WITH CONTRAST TECHNIQUE: Multiplanar, multiecho pulse sequences of the brain and surrounding structures were obtained without and with intravenous contrast. CONTRAST:  10 CC OF GADAVIST. COMPARISON:  Prior CT from 09/30/2018. FINDINGS: Brain: Cerebral volume within normal limits for patient age. No focal parenchymal signal abnormality identified. No abnormal foci of restricted diffusion to suggest acute or subacute ischemia. Minimal diffusion abnormality at the right parietal convexity most consistent with susceptibility artifact related to adjacent calvarial exostosis. Gray-white matter differentiation well maintained. No encephalomalacia to suggest chronic infarction. No foci of susceptibility artifact to suggest acute or chronic intracranial hemorrhage. No mass lesion, midline shift or mass effect. No hydrocephalus. No extra-axial fluid collection. Major dural sinuses are grossly patent. Pituitary gland and suprasellar region are normal. Midline structures intact and normal. No abnormal enhancement. Vascular: Major intracranial vascular flow voids well maintained and normal in appearance. Skull and upper cervical spine: Craniocervical junction normal. Visualized upper cervical spine within normal limits. Bone marrow signal intensity normal.  No scalp soft tissue  abnormality. Sinuses/Orbits: Globes and orbital soft tissues within normal limits. Scattered mucosal thickening throughout the paranasal sinuses. No air-fluid levels to suggest acute sinusitis. Trace opacity bilateral mastoid air cells. Inner ear structures normal. Other: None. IMPRESSION: Normal brain MRI.  No acute intracranial abnormality identified. Electronically Signed   By: Rise MuBenjamin  McClintock M.D.   On: 10/01/2018 04:11   [place] Abdomen Complete  Result Date: 09/28/2018 CLINICAL DATA:  Splenomegaly.  Abnormal liver function studies. EXAM: ABDOMEN ULTRASOUND COMPLETE COMPARISON:  Chest CTA 09/27/2018 FINDINGS: Gallbladder: No gallstones or wall thickening visualized. No sonographic Murphy sign noted by sonographer. Common bile duct: Diameter: 2 mm Liver: Minimally heterogeneous echotexture in the left lobe without focal abnormality. Portal vein is patent on color Doppler imaging with normal direction of blood flow towards the liver. IVC: No abnormality visualized. Pancreas: Visualized portion unremarkable. Spleen: Size and appearance within normal limits. Measures 12.4 x 11.8 x 5.7 cm (volume = 440 cm^3). Right Kidney: Length: 9.5 cm. Echogenicity within normal limits. No mass or hydronephrosis visualized. Left Kidney: Length: 10.7 cm. Echogenicity within normal limits. No mass or hydronephrosis visualized. Abdominal aorta: No aneurysm visualized. Other findings: None. IMPRESSION: 1. The spleen is at the upper limits of normal for size and demonstrates no focal abnormality. 2. No biliary dilatation. 3. No acute abdominal findings. Electronically Signed   By: Carey BullocksWilliam  Veazey M.D.   On: 09/28/2018 16:02   Dg Sniff Test  Result Date: 10/01/2018 CLINICAL DATA:  Shortness of breath and elevated diaphragm EXAM: CHEST FLUOROSCOPY TECHNIQUE: Real-time fluoroscopic evaluation of the chest was performed. FLUOROSCOPY TIME:  Fluoroscopy Time:  0 minutes, 24 seconds Radiation Exposure Index (if provided by the  fluoroscopic device): 2.6 mGy Number of Acquired Spot Images: 0 COMPARISON:  Chest CT 09/30/2018 FINDINGS: Indistinct opacities at the lung bases. The patient had coughing during today's exam. The patient perform several sniff maneuvers. All demonstrate simultaneous and appropriate motion of the hemidiaphragms, without findings of hemidiaphragmatic paralysis. IMPRESSION: 1. Normal diaphragmatic motion with sniff maneuver. No findings of hemidiaphragmatic paralysis. 2. Indistinct bibasilar airspace opacities. Electronically Signed   By: Gaylyn RongWalter  Liebkemann M.D.   On: 10/01/2018 08:29   Dg Chest Port 1 View  Result Date: 09/30/2018 CLINICAL DATA:  Shortness of breath EXAM: PORTABLE CHEST 1 VIEW COMPARISON:  September 29, 2018 FINDINGS: Mild bibasilar opacities, similar in the interval. The heart, hila, mediastinum, lungs, and pleura are otherwise unremarkable. IMPRESSION: Mild bibasilar opacities, suspicious for pneumonia, are stable. Electronically Signed   By: Gerome Samavid  Williams III M.D   On: 09/30/2018 15:17     Subjective: Feeling better, dyspnea improved.   Discharge Exam: Vitals:   10/02/18 0713 10/02/18 0800  BP: 119/76   Pulse: (!) 101 90  Resp: (!) 30   Temp: 98 F (36.7 C)   SpO2: 98% 97%     General: Pt is alert, awake, not in acute distress Cardiovascular: RRR, S1/S2 +, no rubs, no gallops Respiratory: CTA bilaterally, no wheezing, no rhonchi Abdominal: Soft, NT, ND, bowel sounds + Extremities: no edema, no cyanosis    The results of significant diagnostics from this hospitalization (including imaging, microbiology, ancillary and laboratory) are listed below for reference.     Microbiology: Recent Results (from the past 240 hour(s))  Respiratory Panel by PCR     Status: None   Collection Time: 09/27/18  8:03 PM  Result Value Ref Range Status   Adenovirus NOT DETECTED NOT DETECTED Final   Coronavirus 229E NOT DETECTED  NOT DETECTED Final    Comment: (NOTE) The Coronavirus  on the Respiratory Panel, DOES NOT test for the novel  Coronavirus (2019 nCoV)    Coronavirus HKU1 NOT DETECTED NOT DETECTED Final   Coronavirus NL63 NOT DETECTED NOT DETECTED Final   Coronavirus OC43 NOT DETECTED NOT DETECTED Final   Metapneumovirus NOT DETECTED NOT DETECTED Final   Rhinovirus / Enterovirus NOT DETECTED NOT DETECTED Final   Influenza A NOT DETECTED NOT DETECTED Final   Influenza B NOT DETECTED NOT DETECTED Final   Parainfluenza Virus 1 NOT DETECTED NOT DETECTED Final   Parainfluenza Virus 2 NOT DETECTED NOT DETECTED Final   Parainfluenza Virus 3 NOT DETECTED NOT DETECTED Final   Parainfluenza Virus 4 NOT DETECTED NOT DETECTED Final   Respiratory Syncytial Virus NOT DETECTED NOT DETECTED Final   Bordetella pertussis NOT DETECTED NOT DETECTED Final   Chlamydophila pneumoniae NOT DETECTED NOT DETECTED Final   Mycoplasma pneumoniae NOT DETECTED NOT DETECTED Final    Comment: Performed at [facility], [address], [place], [place] [address]  Expectorated sputum assessment w rflx to resp cult     Status: None   Collection Time: 09/29/18 10:38 AM  Result Value Ref Range Status   Specimen Description SPU  Final   Special Requests NONE  Final   Sputum evaluation   Final    Sputum specimen not acceptable for testing.  Please recollect.   Gram Stain Report Called to,Read Back By and Verified With: M BROOKS,RN AT 1945 09/29/2018 BY L BENFIELD Performed at [facility], [address], [place], [place] [address]    Report Status 09/29/2018 FINAL  Final  Culture, blood (routine x 2)     Status: None (Preliminary result)   Collection Time: 09/29/18 12:26 PM  Result Value Ref Range Status   Specimen Description BLOOD LEFT HAND  Final   Special Requests   Final    BOTTLES DRAWN AEROBIC ONLY Blood Culture adequate volume   Culture   Final    NO GROWTH 3 DAYS Performed at [facility], [address], [place], [place] [address]    Report Status PENDING   Incomplete  Culture, blood (routine x 2)     Status: None (Preliminary result)   Collection Time: 09/29/18 12:32 PM  Result Value Ref Range Status   Specimen Description BLOOD RIGHT HAND  Final   Special Requests   Final    BOTTLES DRAWN AEROBIC ONLY Blood Culture adequate volume   Culture   Final    NO GROWTH 3 DAYS Performed at [facility], [address] [place], [place] [address]    Report Status PENDING  Incomplete  Urine Culture     Status: None   Collection Time: 09/30/18  7:01 AM  Result Value Ref Range Status   Specimen Description URINE, RANDOM  Final   Special Requests NONE  Final   Culture   Final    NO GROWTH Performed at [facility], [address] [place], [place] [address]    Report Status 10/01/2018 FINAL  Final  MRSA PCR Screening     Status: None   Collection Time: 09/30/18 10:27 AM  Result Value Ref Range Status   MRSA by PCR NEGATIVE NEGATIVE Final    Comment:        The GeneXpert MRSA Assay (FDA approved for NASAL specimens only), is one component of a comprehensive MRSA colonization surveillance program. It is not intended  to diagnose MRSA infection nor to guide or monitor treatment for MRSA infections. Performed at [facility], [address] [place], [place] [address]   Expectorated sputum assessment w rflx to resp cult     Status: None   Collection Time: 09/30/18  3:20 PM  Result Value Ref Range Status   Specimen Description EXPECTORATED SPUTUM  Final   Special Requests Normal  Final   Sputum evaluation   Final    Sputum specimen not acceptable for testing.  Please recollect.   Gram Stain Report Called to,Read Back By and Verified With: M REED,RN AT 1746 09/30/2018 BY L BENFIELD Performed at [facility], [address], [place], [place] [address]    Report Status 09/30/2018 FINAL  Final     Labs: BNP (last 3 results) Recent Labs    09/28/18 0215  BNP 4.6   Basic Metabolic Panel: Recent Labs  Lab  09/27/18 1611 09/28/18 0654 09/29/18 0311 09/30/18 0859 10/01/18 0801  NA 135 137 135 134* 135  K 4.1 3.8 4.1 4.0 4.2  CL 102 109 102 104 102  CO2 26 22 21* 21* 23  GLUCOSE 122* 98 116* 90 146*  BUN 7 <5* <5* 5* 6  CREATININE 1.02* 0.90 0.98 0.82 0.79  CALCIUM 9.1 8.1* 8.4* 8.1* 8.7*   Liver Function Tests: Recent Labs  Lab 09/28/18 0215  AST 42*  ALT 43  ALKPHOS 41  BILITOT 0.5  PROT 6.8  ALBUMIN 2.7*   Recent Labs  Lab 09/28/18 0654  LIPASE 24   No results for input(s): AMMONIA in the last 168 hours. CBC: Recent Labs  Lab 09/27/18 1611 09/28/18 0654 09/29/18 0311 09/30/18 0859 10/01/18 0801  WBC 7.2 6.1 7.5 8.1 6.5  NEUTROABS 1.8  --   --   --   --   HGB 12.9 11.6* 11.8* 11.7* 12.6  HCT 42.6 36.3 38.5 38.2 39.7  MCV 79.6* 77.2* 78.1* 78.0* 77.7*  PLT 166 149* 153 147* 179   Cardiac Enzymes: Recent Labs  Lab 09/28/18 0215 09/28/18 0654 09/28/18 1248  CKTOTAL 90  --   --   TROPONINI <0.03 <0.03 <0.03   BNP: Invalid input(s): POCBNP CBG: Recent Labs  Lab 09/30/18 2118  GLUCAP 105*   D-Dimer No results for input(s): DDIMER in the last 72 hours. Hgb A1c No results for input(s): HGBA1C in the last 72 hours. Lipid Profile No results for input(s): CHOL, HDL, LDLCALC, TRIG, CHOLHDL, LDLDIRECT in the last 72 hours. Thyroid function studies No results for input(s): TSH, T4TOTAL, T3FREE, THYROIDAB in the last 72 hours.  Invalid input(s): FREET3 Anemia work up No results for input(s): VITAMINB12, FOLATE, FERRITIN, TIBC, IRON, RETICCTPCT in the last 72 hours. Urinalysis    Component Value Date/Time   COLORURINE YELLOW 09/29/2018 1641   APPEARANCEUR HAZY (A) 09/29/2018 1641   LABSPEC 1.014 09/29/2018 1641   PHURINE 6.0 09/29/2018 1641   GLUCOSEU NEGATIVE 09/29/2018 1641   HGBUR LARGE (A) 09/29/2018 1641   BILIRUBINUR NEGATIVE 09/29/2018 1641   KETONESUR NEGATIVE 09/29/2018 1641   PROTEINUR NEGATIVE 09/29/2018 1641   UROBILINOGEN 1.0  04/06/2015 1140   NITRITE NEGATIVE 09/29/2018 1641   LEUKOCYTESUR MODERATE (A) 09/29/2018 1641   Sepsis Labs Invalid input(s): PROCALCITONIN,  WBC,  LACTICIDVEN Microbiology Recent Results (from the past 240 hour(s))  Respiratory Panel by PCR     Status: None   Collection Time: 09/27/18  8:03 PM  Result Value Ref Range Status   Adenovirus NOT DETECTED  NOT DETECTED Final   Coronavirus 229E NOT DETECTED NOT DETECTED Final    Comment: (NOTE) The Coronavirus on the Respiratory Panel, DOES NOT test for the novel  Coronavirus (2019 nCoV)    Coronavirus HKU1 NOT DETECTED NOT DETECTED Final   Coronavirus NL63 NOT DETECTED NOT DETECTED Final   Coronavirus OC43 NOT DETECTED NOT DETECTED Final   Metapneumovirus NOT DETECTED NOT DETECTED Final   Rhinovirus / Enterovirus NOT DETECTED NOT DETECTED Final   Influenza A NOT DETECTED NOT DETECTED Final   Influenza B NOT DETECTED NOT DETECTED Final   Parainfluenza Virus 1 NOT DETECTED NOT DETECTED Final   Parainfluenza Virus 2 NOT DETECTED NOT DETECTED Final   Parainfluenza Virus 3 NOT DETECTED NOT DETECTED Final   Parainfluenza Virus 4 NOT DETECTED NOT DETECTED Final   Respiratory Syncytial Virus NOT DETECTED NOT DETECTED Final   Bordetella pertussis NOT DETECTED NOT DETECTED Final   Chlamydophila pneumoniae NOT DETECTED NOT DETECTED Final   Mycoplasma pneumoniae NOT DETECTED NOT DETECTED Final    Comment: Performed at [facility], [address] [place], [place] [address]  Expectorated sputum assessment w rflx to resp cult     Status: None   Collection Time: 09/29/18 10:38 AM  Result Value Ref Range Status   Specimen Description SPU  Final   Special Requests NONE  Final   Sputum evaluation   Final    Sputum specimen not acceptable for testing.  Please recollect.   Gram Stain Report Called to,Read Back By and Verified With: M BROOKS,RN AT 1945 09/29/2018 BY L BENFIELD Performed at [facility], [address] [place], [place]  [address]    Report Status 09/29/2018 FINAL  Final  Culture, blood (routine x 2)     Status: None (Preliminary result)   Collection Time: 09/29/18 12:26 PM  Result Value Ref Range Status   Specimen Description BLOOD LEFT HAND  Final   Special Requests   Final    BOTTLES DRAWN AEROBIC ONLY Blood Culture adequate volume   Culture   Final    NO GROWTH 3 DAYS Performed at [facility], [address] [place], [place] [address]    Report Status PENDING  Incomplete  Culture, blood (routine x 2)     Status: None (Preliminary result)   Collection Time: 09/29/18 12:32 PM  Result Value Ref Range Status   Specimen Description BLOOD RIGHT HAND  Final   Special Requests   Final    BOTTLES DRAWN AEROBIC ONLY Blood Culture adequate volume   Culture   Final    NO GROWTH 3 DAYS Performed at [facility], [address], [place], [place] [address]    Report Status PENDING  Incomplete  Urine Culture     Status: None   Collection Time: 09/30/18  7:01 AM  Result Value Ref Range Status   Specimen Description URINE, RANDOM  Final   Special Requests NONE  Final   Culture   Final    NO GROWTH Performed at [facility], [address], [place], [place] [address]    Report Status 10/01/2018 FINAL  Final  MRSA PCR Screening     Status: None   Collection Time: 09/30/18 10:27 AM  Result Value Ref Range Status   MRSA by PCR NEGATIVE NEGATIVE Final    Comment:        The GeneXpert MRSA Assay (FDA approved for NASAL specimens only), is one component of a comprehensive  MRSA colonization surveillance program. It is not intended to diagnose MRSA infection nor to guide or monitor treatment for MRSA infections. Performed at [facility], [address], [place], [place] [address]   Expectorated sputum assessment w rflx to resp cult     Status: None   Collection Time: 09/30/18  3:20 PM  Result Value Ref Range Status   Specimen Description EXPECTORATED SPUTUM  Final   Special  Requests Normal  Final   Sputum evaluation   Final    Sputum specimen not acceptable for testing.  Please recollect.   Gram Stain Report Called to,Read Back By and Verified With: M REED,RN AT 1746 09/30/2018 BY L BENFIELD Performed at [facility], [address] [place], [place] [address]    Report Status 09/30/2018 FINAL  Final     Time coordinating discharge: 40 minutes  SIGNED:   Alba Cory, MD  [facility]

## 2018-10-04 LAB — CULTURE, BLOOD (ROUTINE X 2)
Culture: NO GROWTH
Culture: NO GROWTH
SPECIAL REQUESTS: ADEQUATE
Special Requests: ADEQUATE

## 2018-10-08 LAB — ALLERGENS W/TOTAL IGE AREA 2
Alternaria Alternata IgE: <0.1 kU/L
Aspergillus Fumigatus IgE: <0.1 kU/L
Bermuda Grass IgE: <0.1 kU/L
Cedar, Mountain IgE: <0.1 kU/L
Cockroach, German IgE: <0.1 kU/L
Common Silver Birch IgE: UNDETERMINED kU/L
Cottonwood IgE: <0.1 kU/L
D Farinae IgE: 1.26 kU/L — AB
D Pteronyssinus IgE: 0.29 kU/L — AB
Elm, American IgE: <0.1 kU/L
IGE (IMMUNOGLOBULIN E), SERUM: 62 [IU]/mL (ref 6–495)
Johnson Grass IgE: <0.1 kU/L
Maple/Box Elder IgE: <0.1 kU/L
Mouse Urine IgE: <0.1 kU/L
Oak, White IgE: <0.1 kU/L
Pecan, Hickory IgE: <0.1 kU/L
Penicillium Chrysogen IgE: <0.1 kU/L
Pigweed, Rough IgE: <0.1 kU/L
Ragweed, Short IgE: <0.1 kU/L
Sheep Sorrel IgE Qn: UNDETERMINED kU/L
Timothy Grass IgE: <0.1 kU/L
White Mulberry IgE: UNDETERMINED kU/L

## 2019-10-02 ENCOUNTER — Other Ambulatory Visit: Payer: Self-pay | Admitting: Pulmonary Disease

## 2019-10-02 DIAGNOSIS — R0600 Dyspnea, unspecified: Secondary | ICD-10-CM

## 2020-02-07 IMAGING — MR MR HEAD WO/W CM
12 of 14 series · 38 of 48 positions shown · IV contrast (Contrast agent)
Comparison: Prior CT from 09/30/2018.

CLINICAL DATA: INITIAL EVALUATION FOR ACUTE ALTERED MENTAL STATUS.

EXAM:
MRI HEAD WITHOUT AND WITH CONTRAST
TECHNIQUE: Multiplanar, multiecho pulse sequences of the brain and surrounding
structures were obtained without and with intravenous contrast.
CONTRAST:  10 CC OF GADAVIST.

[Series 5: DWI · axial · 3.0mm · 0.88mm/px · z∈[-86,+45]mm · 6 of 92 slices shown (1 of 4)]
[im 1/92]
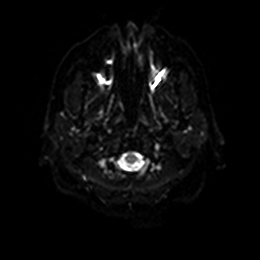
[im 19/92]
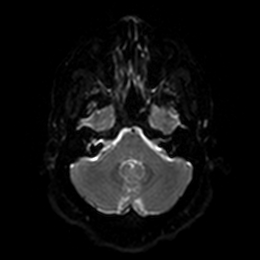
[im 37/92]
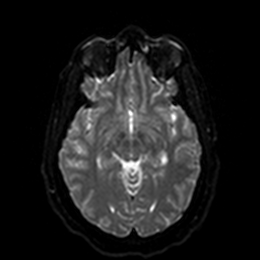
[im 55/92]
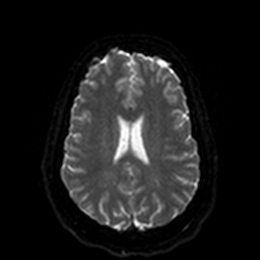
[im 73/92]
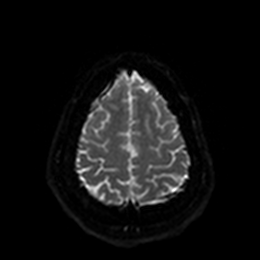
[im 92/92]
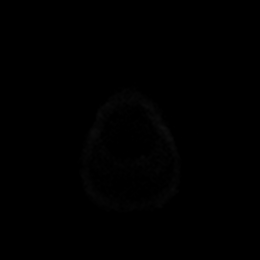

[Series 6: DWI · axial · 3.0mm · 0.88mm/px · z∈[-86,+45]mm · 3 of 46 slices shown (2 of 4)]
[im 1/46]
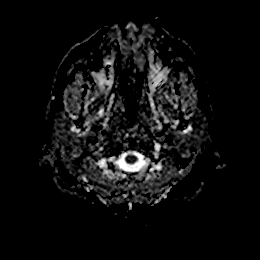
[im 23/46]
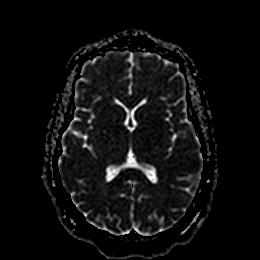
[im 46/46]
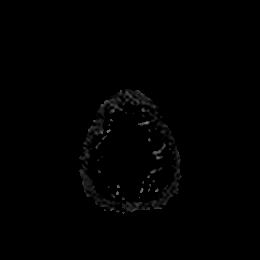

[Series 7: DWI · coronal · 4.0mm · 0.88mm/px · 5 of 68 slices shown (3 of 4)]
[im 1/68]
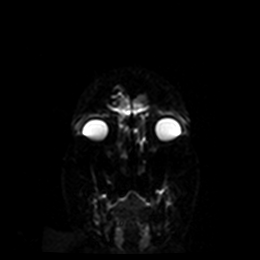
[im 17/68]
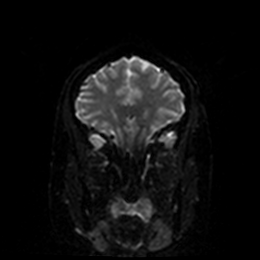
[im 34/68]
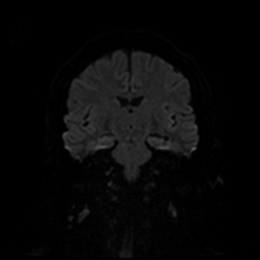
[im 51/68]
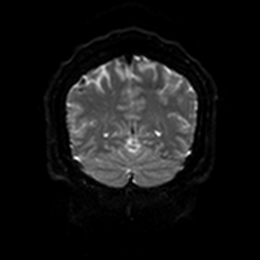
[im 68/68]
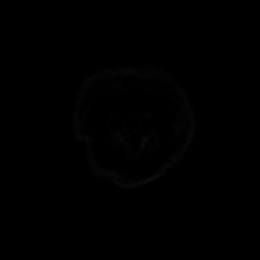

[Series 8: DWI · coronal · 4.0mm · 0.88mm/px · 3 of 34 slices shown (4 of 4)]
[im 1/34]
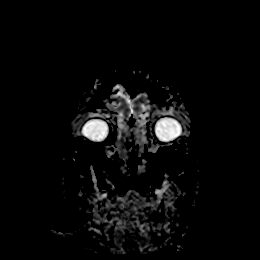
[im 17/34]
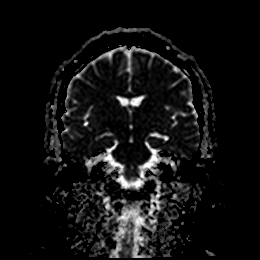
[im 34/34]
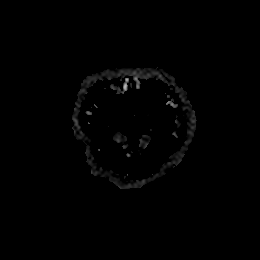

[Series 9: T1 · sagittal · 5.0mm · 0.75mm/px · 2 of 23 slices shown]
[im 1/23]
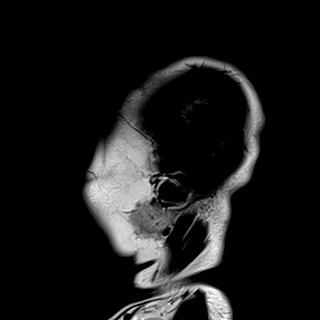
[im 23/23]
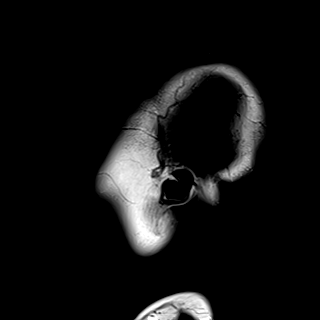

[Series 10: T2 · axial · 5.0mm · 0.72mm/px · z∈[-90,+51]mm · 2 of 25 slices shown]
[im 1/25]
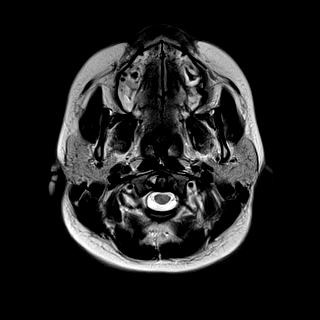
[im 25/25]
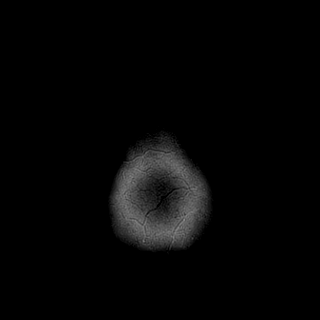

[Series 11: FLAIR · axial · 5.0mm · 0.45mm/px · z∈[-89,+52]mm · 2 of 25 slices shown]
[im 1/25]
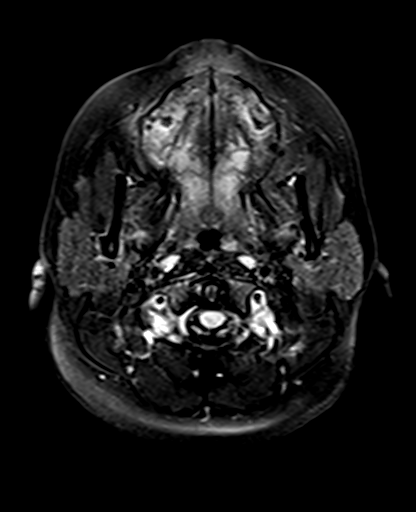
[im 25/25]
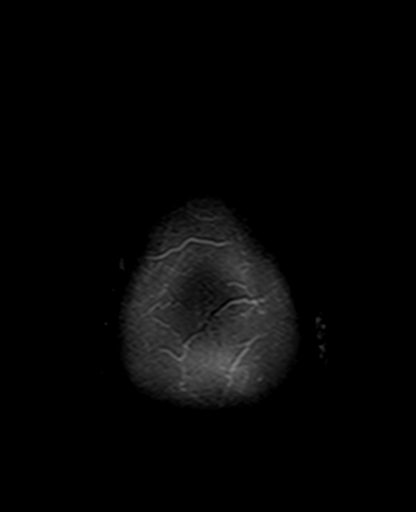

[Series 13: pha_images · axial · 3.0mm · 0.90mm/px · z∈[-105,+65]mm · 4 of 59 slices shown]
[im 1/59]
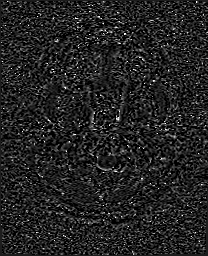
[im 20/59]
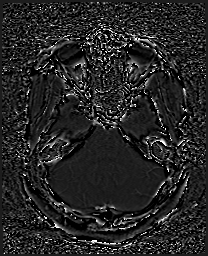
[im 39/59]
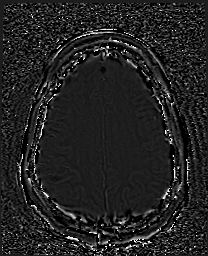
[im 59/59]
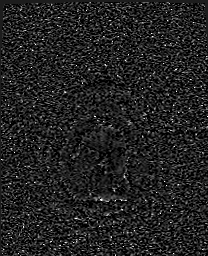

[Series 14: swi_images · axial · 3.0mm · 0.90mm/px · z∈[-105,+68]mm · 5 of 60 slices shown]
[im 1/60]
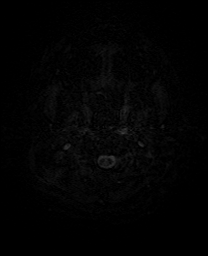
[im 15/60]
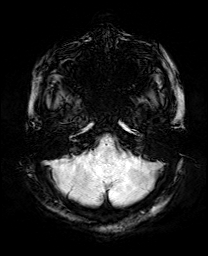
[im 30/60]
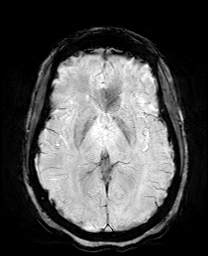
[im 45/60]
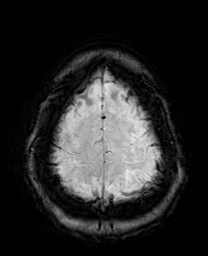
[im 60/60]
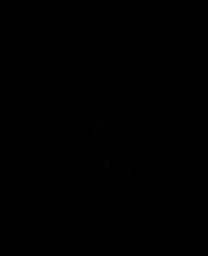

[Series 17: T2 post-contrast · coronal · 5.0mm · 0.72mm/px · 2 of 30 slices shown]
[im 1/30]
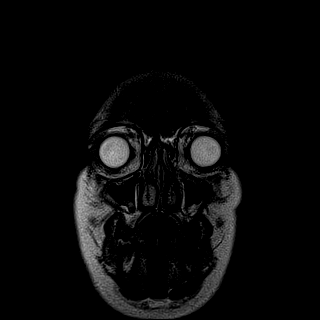
[im 30/30]
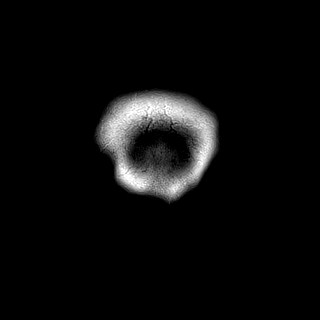

[Series 19: T1 post-contrast · coronal · 5.0mm · 0.34mm/px · 2 of 30 slices shown (1 of 2)]
[im 1/30]
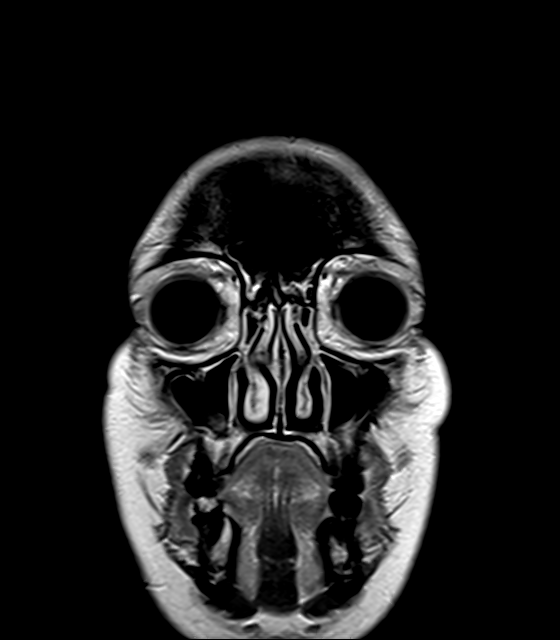
[im 30/30]
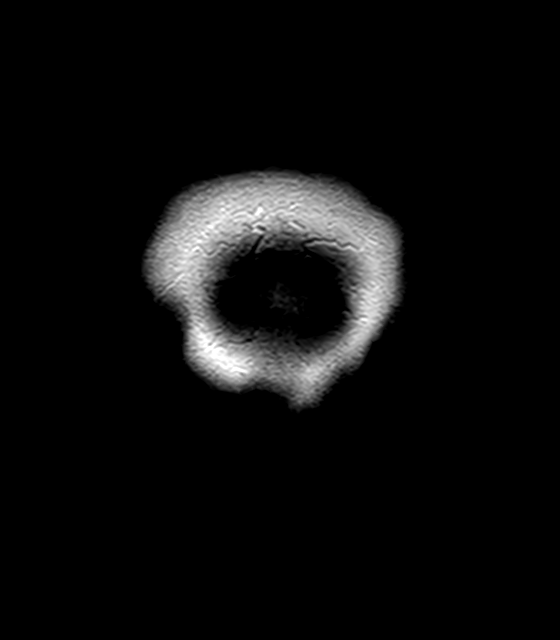

[Series 20: T1 post-contrast · sagittal · 5.0mm · 0.72mm/px · 2 of 23 slices shown (2 of 2)]
[im 1/23]
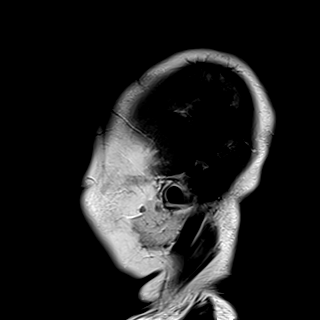
[im 23/23]
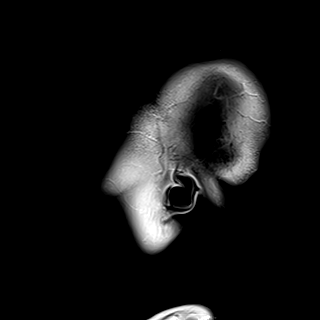

[38 of 48 positions shown; findings below may reference images not displayed]

FINDINGS: Brain: Cerebral volume within normal limits for patient age. No
focal parenchymal signal abnormality identified. No abnormal foci of
restricted diffusion to suggest acute or subacute ischemia. Minimal
diffusion abnormality at the right parietal convexity most
consistent with susceptibility artifact related to adjacent
calvarial exostosis. Gray-white matter differentiation well
maintained. No encephalomalacia to suggest chronic infarction. No
foci of susceptibility artifact to suggest acute or chronic
intracranial hemorrhage.

No mass lesion, midline shift or mass effect. No hydrocephalus. No
extra-axial fluid collection. Major dural sinuses are grossly
patent.

Pituitary gland and suprasellar region are normal. Midline
structures intact and normal.

No abnormal enhancement.

Vascular: Major intracranial vascular flow voids well maintained and
normal in appearance.

Skull and upper cervical spine: Craniocervical junction normal.
Visualized upper cervical spine within normal limits. Bone marrow
signal intensity normal. No scalp soft tissue abnormality.

Sinuses/Orbits: Globes and orbital soft tissues within normal
limits. Scattered mucosal thickening throughout the paranasal
sinuses. No air-fluid levels to suggest acute sinusitis. Trace
opacity bilateral mastoid air cells. Inner ear structures normal.

Other: None.
IMPRESSION: Normal brain MRI.  No acute intracranial abnormality identified.

## 2020-08-15 NOTE — L&D Delivery Note (Signed)
OB/GYN Faculty Practice Delivery Note  Olivia Mccall is a 30 y.o. [id number] s/p SVD at [redacted]w[redacted]d. She was admitted for PPROM.   ROM: 36h 63m with clear fluid GBS Status: Unknown; received PCN  Delivery Date/Time: 05/05/21 at 2309  Delivery: Called to room and fetal head was delivered at the perineum. No nuchal cord present. Shoulder and body delivered in usual fashion. Infant with spontaneous cry, placed on mother's abdomen, dried and stimulated. Cord clamped x 2 after 1-minute delay, and cut by family member under direct supervision. Infant handed off to awaiting NICU team. Cord blood and cord gas drawn. Placenta delivered spontaneously with gentle cord traction. Fundus firm with massage and Pitocin. Labia, perineum, vagina, and cervix were inspected, and no lacerations were noted.   Placenta: Intact; 3VC Complications: None  Lacerations: None  EBL: 212 cc Analgesia: Epidural  Arterial cord gas: pH 7.342  Infant: Viable female  APGARs 8 and 9  Weight pending  Evalina Field, MD OB/GYN Fellow, Faculty Practice

## 2020-12-31 ENCOUNTER — Encounter (HOSPITAL_BASED_OUTPATIENT_CLINIC_OR_DEPARTMENT_OTHER): Payer: Self-pay | Admitting: *Deleted

## 2020-12-31 ENCOUNTER — Other Ambulatory Visit: Payer: Self-pay

## 2020-12-31 ENCOUNTER — Emergency Department (HOSPITAL_BASED_OUTPATIENT_CLINIC_OR_DEPARTMENT_OTHER): Payer: BC Managed Care – PPO

## 2020-12-31 ENCOUNTER — Emergency Department (HOSPITAL_BASED_OUTPATIENT_CLINIC_OR_DEPARTMENT_OTHER)
Admission: EM | Admit: 2020-12-31 | Discharge: 2021-01-01 | Disposition: A | Discharge status: Home / Self Care | Payer: BC Managed Care – PPO | Attending: Emergency Medicine | Admitting: Emergency Medicine

## 2020-12-31 DIAGNOSIS — R000 Tachycardia, unspecified: Secondary | ICD-10-CM | POA: Insufficient documentation

## 2020-12-31 DIAGNOSIS — R059 Cough, unspecified: Secondary | ICD-10-CM | POA: Diagnosis present

## 2020-12-31 DIAGNOSIS — Z20822 Contact with and (suspected) exposure to covid-19: Secondary | ICD-10-CM | POA: Insufficient documentation

## 2020-12-31 DIAGNOSIS — R0602 Shortness of breath: Secondary | ICD-10-CM | POA: Diagnosis not present

## 2020-12-31 DIAGNOSIS — R509 Fever, unspecified: Secondary | ICD-10-CM | POA: Diagnosis not present

## 2020-12-31 DIAGNOSIS — R002 Palpitations: Secondary | ICD-10-CM | POA: Diagnosis not present

## 2020-12-31 LAB — CBC WITH DIFFERENTIAL/PLATELET
Abs Immature Granulocytes: 0.03 10*3/uL (ref 0.00–0.07)
Basophils Absolute: 0 10*3/uL (ref 0.0–0.1)
Basophils Relative: 0 %
Eosinophils Absolute: 0.1 10*3/uL (ref 0.0–0.5)
Eosinophils Relative: 1 %
HCT: 38.3 % (ref 36.0–46.0)
Hemoglobin: 12.6 g/dL (ref 12.0–15.0)
Immature Granulocytes: 0 %
Lymphocytes Relative: 30 %
Lymphs Abs: 2.3 10*3/uL (ref 0.7–4.0)
MCH: 26.4 pg (ref 26.0–34.0)
MCHC: 32.9 g/dL (ref 30.0–36.0)
MCV: 80.3 fL (ref 80.0–100.0)
Monocytes Absolute: 0.7 10*3/uL (ref 0.1–1.0)
Monocytes Relative: 10 %
Neutro Abs: 4.3 10*3/uL (ref 1.7–7.7)
Neutrophils Relative %: 59 %
Platelets: 184 10*3/uL (ref 150–400)
RBC: 4.77 MIL/uL (ref 3.87–5.11)
RDW: 13.9 % (ref 11.5–15.5)
WBC: 7.4 10*3/uL (ref 4.0–10.5)
nRBC: 0 % (ref 0.0–0.2)

## 2020-12-31 LAB — COMPREHENSIVE METABOLIC PANEL
ALT: 24 U/L (ref 0–44)
AST: 22 U/L (ref 15–41)
Albumin: 3.2 g/dL — ABNORMAL LOW (ref 3.5–5.0)
Alkaline Phosphatase: 53 U/L (ref 38–126)
Anion gap: 8 (ref 5–15)
BUN: 6 mg/dL (ref 6–20)
CO2: 21 mmol/L — ABNORMAL LOW (ref 22–32)
Calcium: 9.1 mg/dL (ref 8.9–10.3)
Chloride: 105 mmol/L (ref 98–111)
Creatinine, Ser: 0.77 mg/dL (ref 0.44–1.00)
GFR, Estimated: >60 mL/min (ref >60)
Glucose, Bld: 128 mg/dL — ABNORMAL HIGH (ref 70–99)
Potassium: 3.5 mmol/L (ref 3.5–5.1)
Sodium: 134 mmol/L — ABNORMAL LOW (ref 135–145)
Total Bilirubin: 0.2 mg/dL — ABNORMAL LOW (ref 0.3–1.2)
Total Protein: 7.1 g/dL (ref 6.5–8.1)

## 2020-12-31 MED ORDER — AZITHROMYCIN 250 MG PO TABS: ORAL_TABLET | ORAL | 0 refills | Status: DC

## 2020-12-31 NOTE — ED Provider Notes (Signed)
[facility] EMERGENCY DEPARTMENT Provider Note   CSN: [id number] Arrival date & time: 12/31/20  2136     History Chief Complaint  Patient presents with  . Shortness of Breath    Olivia Mccall is a 30 y.o. female.  Patient presents chief complaint of cough, subjective fever, shortness of breath and palpitations ongoing for 1 day.  She denies any headache or chest pain or abdominal pain.  She states that she had similar symptoms a few years ago and was admitted for pneumonia.  She is concerned that very similar symptoms are starting up again.        Past Medical History:  Diagnosis Date  . Medical history non-contributory     Patient Active Problem List   Diagnosis Date Noted  . Acute respiratory failure with hypoxia (HCC) 09/27/2018    Past Surgical History:  Procedure Laterality Date  . NO PAST SURGERIES       OB History    Gravida  2   Para      Term      Preterm      AB      Living  1     SAB      IAB      Ectopic      Multiple      Live Births              Family History  Problem Relation Age of Onset  . Diabetes Mellitus II Maternal Grandmother     Social History   Tobacco Use  . Smoking status: Never Smoker  . Smokeless tobacco: Never Used  Substance Use Topics  . Alcohol use: No    Comment: socially  . Drug use: No    Home Medications Prior to Admission medications   Medication Sig Start Date End Date Taking? Authorizing Provider  azithromycin (ZITHROMAX) 500 MG tablet Take 1 tablet (500 mg total) by mouth daily. Patient not taking: Reported on 12/31/2020 10/02/18   Regalado, Jon Billings A, MD  famotidine (PEPCID) 20 MG tablet Take 1 tablet (20 mg total) by mouth 2 (two) times daily. 10/02/18   Regalado, Belkys A, MD  fluticasone (FLOVENT HFA) 110 MCG/ACT inhaler Inhale 2 puffs into the lungs 2 (two) times daily. Patient not taking: Reported on 12/31/2020 10/01/18 10/01/19  Lynnell Catalan, MD  guaiFENesin (MUCINEX)  600 MG 12 hr tablet Take 1 tablet (600 mg total) by mouth 2 (two) times daily. 10/02/18   Regalado, Prentiss Bells, MD  Spacer/Aero-Holding Chambers (AEROCHAMBER MV) inhaler Use as instructed 10/01/18   Lynnell Catalan, MD  TAYTULLA 1-20 MG-MCG(24) CAPS Take 1 tablet by mouth daily. 09/08/18   [provider]    Allergies    Vancomycin  Review of Systems   Review of Systems  Constitutional: Positive for fever.  HENT: Negative for ear pain.   Eyes: Negative for pain.  Respiratory: Positive for cough.   Cardiovascular: Negative for chest pain.  Gastrointestinal: Negative for abdominal pain.  Genitourinary: Negative for flank pain.  Musculoskeletal: Negative for back pain.  Skin: Negative for rash.  Neurological: Negative for headaches.    Physical Exam Updated Vital Signs BP (!) 113/59 (BP Location: Left Arm)   Pulse 92   Temp 98.8 F (37.1 C) (Oral)   Resp 20   Ht 5\' 1"  (1.549 m)   Wt 122.5 kg   SpO2 99%   BMI 51.02 kg/m   Physical Exam Constitutional:      General: She  is not in acute distress.    Appearance: Normal appearance.  HENT:     Head: Normocephalic.     Nose: Nose normal.  Eyes:     Extraocular Movements: Extraocular movements intact.  Cardiovascular:     Rate and Rhythm: Normal rate.  Pulmonary:     Effort: Pulmonary effort is normal. No tachypnea.     Breath sounds: No decreased breath sounds or wheezing.  Musculoskeletal:        General: Normal range of motion.     Cervical back: Normal range of motion.  Neurological:     General: No focal deficit present.     Mental Status: She is alert. Mental status is at baseline.     ED Results / Procedures / Treatments   Labs (all labs ordered are listed, but only abnormal results are displayed) Labs Reviewed  COMPREHENSIVE METABOLIC PANEL - Abnormal; Notable for the following components:      Result Value   Sodium 134 (*)    CO2 21 (*)    Glucose, Bld 128 (*)    Albumin 3.2 (*)    Total Bilirubin  0.2 (*)    All other components within normal limits  CBC WITH DIFFERENTIAL/PLATELET    EKG EKG Interpretation  Date/Time:  Thursday Dec 31 2020 22:01:04 EDT Ventricular Rate:  100 PR Interval:  136 QRS Duration: 72 QT Interval:  348 QTC Calculation: 448 R Axis:   60 Text Interpretation: Normal sinus rhythm Normal ECG No significant change since prior 2/20 Confirmed by Meridee Score [phone]) on 12/31/2020 10:03:44 PM   Radiology DG Chest 2 View  Result Date: 12/31/2020 CLINICAL DATA:  Shortness of breath EXAM: CHEST - 2 VIEW COMPARISON:  September 30, 2018. FINDINGS: The heart size and mediastinal contours are within normal limits. No focal consolidation. No pleural effusion. No pneumothorax. The visualized skeletal structures are unremarkable. IMPRESSION: No active cardiopulmonary disease. Electronically Signed   By: Maudry Mayhew MD   On: 12/31/2020 22:35    Procedures Procedures   Medications Ordered in ED Medications - No data to display  ED Course  I have reviewed the triage vital signs and the nursing notes.  Pertinent labs & imaging results that were available during my care of the patient were reviewed by me and considered in my medical decision making (see chart for details).    MDM Rules/Calculators/A&P                          Patient noted be slightly tachycardic on arrival.  However at bedside heart rate is 88 bpm, O2 saturation 99% on room air which is appropriate.  She appears comfortable at rest at this time.  She has no leg swelling bilaterally.  She had no complaints of chest pain.  She is not tachycardic.  Labs otherwise unremarkable, chest x-ray is unremarkable as well.  Given her history, she is concerned again for early pneumonia.  Will treat empirically with antibiotics.  Recommended close follow-up with her doctor within 2 or 3 days, recommend immediate return for worsening symptoms difficulty breathing or any additional concerns.  Final Clinical  Impression(s) / ED Diagnoses Final diagnoses:  Cough    Rx / DC Orders ED Discharge Orders    None       Cheryll Cockayne, MD 12/31/20 2352

## 2020-12-31 NOTE — ED Triage Notes (Addendum)
C/o SOb and palpatations with exertion x 1 day , similar episode 2020 with amit to [facility] for Hypoxia and tachycardia  , amb into triage , with SOB labored  unable to complete sentence HR 134.

## 2020-12-31 NOTE — Discharge Instructions (Addendum)
Call your primary care doctor or specialist as discussed in the next 2-3 days.   Return immediately back to the ER if:  Your symptoms worsen within the next 12-24 hours. You develop new symptoms such as new fevers, persistent vomiting, new pain, shortness of breath, or new weakness or numbness, or if you have any other concerns.  

## 2021-01-01 LAB — SARS CORONAVIRUS 2 (TAT 6-24 HRS): SARS Coronavirus 2: NEGATIVE

## 2021-05-04 ENCOUNTER — Other Ambulatory Visit: Payer: Self-pay

## 2021-05-04 ENCOUNTER — Inpatient Hospital Stay (HOSPITAL_COMMUNITY)
Admission: AD | Admit: 2021-05-04 | Discharge: 2021-05-07 | DRG: 807 | Disposition: A | Discharge status: Home / Self Care | Payer: BC Managed Care – PPO | Attending: Obstetrics & Gynecology | Admitting: Obstetrics & Gynecology

## 2021-05-04 ENCOUNTER — Encounter (HOSPITAL_COMMUNITY): Payer: Self-pay | Admitting: Obstetrics and Gynecology

## 2021-05-04 DIAGNOSIS — Z3A34 34 weeks gestation of pregnancy: Secondary | ICD-10-CM | POA: Diagnosis not present

## 2021-05-04 DIAGNOSIS — O42113 Preterm premature rupture of membranes, onset of labor more than 24 hours following rupture, third trimester: Secondary | ICD-10-CM | POA: Diagnosis present

## 2021-05-04 DIAGNOSIS — O42919 Preterm premature rupture of membranes, unspecified as to length of time between rupture and onset of labor, unspecified trimester: Secondary | ICD-10-CM

## 2021-05-04 DIAGNOSIS — Z3A00 Weeks of gestation of pregnancy not specified: Secondary | ICD-10-CM | POA: Diagnosis not present

## 2021-05-04 DIAGNOSIS — O24424 Gestational diabetes mellitus in childbirth, insulin controlled: Secondary | ICD-10-CM | POA: Diagnosis present

## 2021-05-04 DIAGNOSIS — O24419 Gestational diabetes mellitus in pregnancy, unspecified control: Secondary | ICD-10-CM

## 2021-05-04 DIAGNOSIS — O24414 Gestational diabetes mellitus in pregnancy, insulin controlled: Secondary | ICD-10-CM

## 2021-05-04 DIAGNOSIS — Z20822 Contact with and (suspected) exposure to covid-19: Secondary | ICD-10-CM | POA: Diagnosis present

## 2021-05-04 DIAGNOSIS — O329XX0 Maternal care for malpresentation of fetus, unspecified, not applicable or unspecified: Secondary | ICD-10-CM

## 2021-05-04 DIAGNOSIS — O4210 Premature rupture of membranes, onset of labor more than 24 hours following rupture, unspecified weeks of gestation: Secondary | ICD-10-CM | POA: Diagnosis present

## 2021-05-04 DIAGNOSIS — O42913 Preterm premature rupture of membranes, unspecified as to length of time between rupture and onset of labor, third trimester: Secondary | ICD-10-CM | POA: Diagnosis present

## 2021-05-04 HISTORY — DX: Type 2 diabetes mellitus without complications: E11.9

## 2021-05-04 LAB — URINALYSIS, ROUTINE W REFLEX MICROSCOPIC
Bilirubin Urine: NEGATIVE
Glucose, UA: 100 mg/dL — AB
Ketones, ur: NEGATIVE mg/dL
Leukocytes,Ua: NEGATIVE
Nitrite: NEGATIVE
Protein, ur: 30 mg/dL — AB
Specific Gravity, Urine: 1.02 (ref 1.005–1.030)
pH: 7 (ref 5.0–8.0)

## 2021-05-04 LAB — COMPREHENSIVE METABOLIC PANEL
ALT: 26 U/L (ref 0–44)
AST: 21 U/L (ref 15–41)
Albumin: 2.8 g/dL — ABNORMAL LOW (ref 3.5–5.0)
Alkaline Phosphatase: 112 U/L (ref 38–126)
Anion gap: 8 (ref 5–15)
BUN: <5 mg/dL — ABNORMAL LOW (ref 6–20)
CO2: 22 mmol/L (ref 22–32)
Calcium: 9.4 mg/dL (ref 8.9–10.3)
Chloride: 107 mmol/L (ref 98–111)
Creatinine, Ser: 0.75 mg/dL (ref 0.44–1.00)
GFR, Estimated: >60 mL/min (ref >60)
Glucose, Bld: 84 mg/dL (ref 70–99)
Potassium: 4.4 mmol/L (ref 3.5–5.1)
Sodium: 137 mmol/L (ref 135–145)
Total Bilirubin: 0.6 mg/dL (ref 0.3–1.2)
Total Protein: 6.5 g/dL (ref 6.5–8.1)

## 2021-05-04 LAB — CBC
HCT: 37.6 % (ref 36.0–46.0)
Hemoglobin: 12 g/dL (ref 12.0–15.0)
MCH: 24.6 pg — ABNORMAL LOW (ref 26.0–34.0)
MCHC: 31.9 g/dL (ref 30.0–36.0)
MCV: 77.2 fL — ABNORMAL LOW (ref 80.0–100.0)
Platelets: 189 10*3/uL (ref 150–400)
RBC: 4.87 MIL/uL (ref 3.87–5.11)
RDW: 15.2 % (ref 11.5–15.5)
WBC: 6.8 10*3/uL (ref 4.0–10.5)
nRBC: 0 % (ref 0.0–0.2)

## 2021-05-04 LAB — TYPE AND SCREEN
ABO/RH(D): B POS
Antibody Screen: NEGATIVE

## 2021-05-04 LAB — URINALYSIS, MICROSCOPIC (REFLEX)

## 2021-05-04 LAB — WET PREP, GENITAL
Clue Cells Wet Prep HPF POC: NONE SEEN
Sperm: NONE SEEN
Trich, Wet Prep: NONE SEEN
Yeast Wet Prep HPF POC: NONE SEEN

## 2021-05-04 LAB — GLUCOSE, CAPILLARY
Glucose-Capillary: 132 mg/dL — ABNORMAL HIGH (ref 70–99)
Glucose-Capillary: 113 mg/dL — ABNORMAL HIGH (ref 70–99)
Glucose-Capillary: 131 mg/dL — ABNORMAL HIGH (ref 70–99)

## 2021-05-04 LAB — RESP PANEL BY RT-PCR (FLU A&B, COVID) ARPGX2
Influenza A by PCR: NEGATIVE
Influenza B by PCR: NEGATIVE
SARS Coronavirus 2 by RT PCR: NEGATIVE

## 2021-05-04 LAB — POCT FERN TEST: POCT Fern Test: POSITIVE

## 2021-05-04 MED ORDER — PRENATAL MULTIVITAMIN CH
1.0000 | ORAL_TABLET | Freq: Every day | ORAL | Status: DC
  Administered 2021-05-04 – 2021-05-05 (×2): 1 via ORAL
  Filled 2021-05-04 (×2): qty 1

## 2021-05-04 MED ORDER — ACETAMINOPHEN 325 MG PO TABS: 650.0000 mg | ORAL_TABLET | ORAL | Status: DC | PRN

## 2021-05-04 MED ORDER — ZOLPIDEM TARTRATE 5 MG PO TABS: 5.0000 mg | ORAL_TABLET | Freq: Every evening | ORAL | Status: DC | PRN

## 2021-05-04 MED ORDER — FERROUS SULFATE 325 (65 FE) MG PO TABS
325.0000 mg | ORAL_TABLET | ORAL | Status: DC
  Administered 2021-05-04 – 2021-05-05 (×2): 325 mg via ORAL
  Filled 2021-05-04 (×2): qty 1

## 2021-05-04 MED ORDER — INSULIN DETEMIR 100 UNIT/ML ~~LOC~~ SOLN
50.0000 [IU] | Freq: Every day | SUBCUTANEOUS | Status: DC
  Administered 2021-05-04: 50 [IU] via SUBCUTANEOUS
  Filled 2021-05-04 (×3): qty 0.5

## 2021-05-04 MED ORDER — CALCIUM CARBONATE ANTACID 500 MG PO CHEW: 2.0000 | CHEWABLE_TABLET | ORAL | Status: DC | PRN

## 2021-05-04 MED ORDER — INSULIN ASPART 100 UNIT/ML IJ SOLN: 0.0000 [IU] | Freq: Three times a day (TID) | INTRAMUSCULAR | Status: DC

## 2021-05-04 MED ORDER — INSULIN DETEMIR 100 UNIT/ML ~~LOC~~ SOLN
50.0000 [IU] | Freq: Every day | SUBCUTANEOUS | Status: DC
  Filled 2021-05-04: qty 0.5

## 2021-05-04 MED ORDER — DOCUSATE SODIUM 100 MG PO CAPS
100.0000 mg | ORAL_CAPSULE | Freq: Every day | ORAL | Status: DC
  Administered 2021-05-05: 100 mg via ORAL
  Filled 2021-05-04 (×2): qty 1

## 2021-05-04 NOTE — Progress Notes (Signed)
Pt informed that the ultrasound is considered a limited OB ultrasound and is not intended to be a complete ultrasound exam.  Patient also informed that the ultrasound is not being completed with the intent of assessing for fetal or placental anomalies or any pelvic abnormalities.  Explained that the purpose of today's ultrasound is to assess for  presentation.  Patient acknowledges the purpose of the exam and the limitations of the study.    Cephalic  Adeena Bernabe, NP   

## 2021-05-04 NOTE — H&P (Addendum)
[facility] ANTEPARTUM ADMISSION HISTORY AND PHYSICAL NOTE   History of Present Illness: Olivia Mccall is a 30 y.o. [id number] at [redacted]w[redacted]d admitted for rupture of membranes.  Reports leaking clear fluid since this morning at 1040 am. PPROM confirmed in MAU. Patient actively leaking clear fluid & fern slide positive x 2. Reports some intermittent back pain. Denies abdominal pain, vaginal bleeding, or fever. Received care at [facility] in [facility] but does not want to deliver in [facility]. Reports she has gestational diabetes & was started on insulin due to elevated fasting blood sugars. Taking levimir 50 units QHS.   Patient reports the fetal movement as decreased . Patient reports uterine contraction  activity as none. Patient reports  vaginal bleeding as none. Patient describes fluid per vagina as Clear. Fetal presentation is cephalic.  Patient Active Problem List   Diagnosis Date Noted   Acute respiratory failure with hypoxia (HCC) 09/27/2018    Past Medical History:  Diagnosis Date   Diabetes mellitus without complication (HCC)    gestational   Medical history non-contributory     Past Surgical History:  Procedure Laterality Date   DILATION AND CURETTAGE OF UTERUS      OB History  Gravida Para Term Preterm AB Living  5 2 2   2 2   SAB IAB Ectopic Multiple Live Births  1 1     2     # Outcome Date GA Lbr Len/2nd Weight Sex Delivery Anes PTL Lv  5 Current           4 IAB 02/12/18 [redacted]w[redacted]d    TAB     3 SAB 12/13/16 [redacted]w[redacted]d         2 Term 09/18/15 [redacted]w[redacted]d    Vag-Spont   LIV  1 Term 03/21/10 [redacted]w[redacted]d    Vag-Spont   LIV    Social History   Socioeconomic History   Marital status: Single    Spouse name: Not on file   Number of children: Not on file   Years of education: Not on file   Highest education level: Not on file  Occupational History   Not on file  Tobacco Use   Smoking status: Never   Smokeless tobacco: Never  Substance and Sexual Activity   Alcohol use: No     Comment: socially   Drug use: No   Sexual activity: Yes    Birth control/protection: None  Other Topics Concern   Not on file  Social History Narrative   Not on file   Social Determinants of Health   Financial Resource Strain: Not on file  Food Insecurity: Not on file  Transportation Needs: Not on file  Physical Activity: Not on file  Stress: Not on file  Social Connections: Not on file    Family History  Problem Relation Age of Onset   Diabetes Mellitus II Maternal Grandmother    Hypertension Maternal Grandfather    Diabetes Maternal Grandfather     Allergies  Allergen Reactions   Vancomycin     Lips swelling, generalized rash, felt  hot.     Medications Prior to Admission  Medication Sig Dispense Refill Last Dose   Accu-Chek Softclix Lancets lancets Use 1 lancet to check blood glucose 4 times daily.      insulin detemir (LEVEMIR) 100 UNIT/ML injection Inject 50 Units into the skin daily.   05/03/2021   Insulin Pen Needle (B-D ULTRAFINE III SHORT PEN) 31G X 8 MM MISC 1 each by Misc.(Non-Drug; Combo Route) route  at bedtime.      ACCU-CHEK GUIDE test strip USE 1 NEW STRIP TO CHECK BLOOD GLUCOSE FOUR TIMES DAILY      Accu-Chek Softclix Lancets lancets 4 (four) times daily.      ferrous sulfate 325 (65 FE) MG tablet Take by mouth.      fluticasone (FLOVENT HFA) 110 MCG/ACT inhaler Inhale 2 puffs into the lungs 2 (two) times daily. (Patient not taking: Reported on 12/31/2020) 1 Inhaler 2    PENTIPS 31G X 8 MM MISC USE AT BEDTIME      Prenatal Vit-DSS-Fe Cbn-FA (PRENATAL AD PO) Take by mouth.      Spacer/Aero-Holding Chambers (AEROCHAMBER MV) inhaler Use as instructed 1 each 0     Review of Systems - Negative except low back pain & SROM History obtained from chart review and the patient  Vitals:  BP 122/75   Pulse (!) 113   Temp 97.9 F (36.6 C) (Oral)   Resp 19   Ht 5\' 1"  (1.549 m)   Wt 120 kg   SpO2 98%   BMI 49.98 kg/m  Physical Examination: CONSTITUTIONAL:  Well-developed, well-nourished female in no acute distress.  HENT:  Normocephalic, atraumatic, External right and left ear normal. Oropharynx is clear and moist EYES: Conjunctivae and EOM are normal. Pupils are equal, round, and reactive to light. No scleral icterus.  NECK: Normal range of motion, supple, no masses SKIN: Skin is warm and dry. No rash noted. Not diaphoretic. No erythema. No pallor. PSYCHIATRIC: Normal mood and affect. Normal behavior. Normal judgment and thought content. CARDIOVASCULAR: Normal heart rate noted, regular rhythm RESPIRATORY: Effort and breath sounds normal, no problems with respiration noted ABDOMEN: Soft, nontender, nondistended, gravid. MUSCULOSKELETAL: Normal range of motion. No edema and no tenderness. 2+ distal pulses. GU: NEFG, clear fluid leaking from introitus   Cervix: Dilation: 1 Effacement (%): Thick Station: Ballotable Exam by:: Brielynn Sekula np Membranes:ruptured Fetal Monitoring:Baseline: 150 bpm, Variability: Good {> 6 bpm), Accelerations: Reactive, and Decelerations: Absent Tocometer: irregular contractions  Labs:  Results for orders placed or performed during the hospital encounter of 05/04/21 (from the past 24 hour(s))  Urinalysis, Routine w reflex microscopic Urine, Clean Catch   Collection Time: 05/04/21 11:59 AM  Result Value Ref Range   Color, Urine YELLOW YELLOW   APPearance CLEAR CLEAR   Specific Gravity, Urine 1.020 1.005 - 1.030   pH 7.0 5.0 - 8.0   Glucose, UA 100 (A) NEGATIVE mg/dL   Hgb urine dipstick TRACE (A) NEGATIVE   Bilirubin Urine NEGATIVE NEGATIVE   Ketones, ur NEGATIVE NEGATIVE mg/dL   Protein, ur 30 (A) NEGATIVE mg/dL   Nitrite NEGATIVE NEGATIVE   Leukocytes,Ua NEGATIVE NEGATIVE  Urinalysis, Microscopic (reflex)   Collection Time: 05/04/21 11:59 AM  Result Value Ref Range   RBC / HPF 0-5 0 - 5 RBC/hpf   WBC, UA 0-5 0 - 5 WBC/hpf   Bacteria, UA RARE (A) NONE SEEN   Squamous Epithelial / LPF 0-5 0 - 5     Imaging Studies: No results found.   Assessment and Plan: 1. Preterm premature rupture of membranes (PPROM) with unknown onset of labor  -Patient adamant that she does not want to be induced due to previous bad experience. Discussed expectant management vs augmentation since she's >34 wks. Discussed risk for infection, fetal intolerance, placental abruption. Patient will consider augmentation after a few days but prefers inpatient expectant management at this time.  -GC/CT, wet prep, & GBS culture collected  2. 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

## 2021-05-04 NOTE — MAU Note (Signed)
Presents stating her water broke @ 1040 this morning, reports fluid is clear.  Denies VB.  Endorses back pain, denies ctxs.  States hasn't felt FM this morning, felt movement yesterday.

## 2021-05-05 ENCOUNTER — Encounter (HOSPITAL_COMMUNITY): Payer: Self-pay | Admitting: Obstetrics and Gynecology

## 2021-05-05 ENCOUNTER — Inpatient Hospital Stay (HOSPITAL_COMMUNITY): Payer: BC Managed Care – PPO | Admitting: Anesthesiology

## 2021-05-05 DIAGNOSIS — Z3A34 34 weeks gestation of pregnancy: Secondary | ICD-10-CM

## 2021-05-05 DIAGNOSIS — O42113 Preterm premature rupture of membranes, onset of labor more than 24 hours following rupture, third trimester: Secondary | ICD-10-CM

## 2021-05-05 DIAGNOSIS — O24424 Gestational diabetes mellitus in childbirth, insulin controlled: Secondary | ICD-10-CM

## 2021-05-05 LAB — CBC
HCT: 38.4 % (ref 36.0–46.0)
Hemoglobin: 12.1 g/dL (ref 12.0–15.0)
MCH: 24.2 pg — ABNORMAL LOW (ref 26.0–34.0)
MCHC: 31.5 g/dL (ref 30.0–36.0)
MCV: 76.8 fL — ABNORMAL LOW (ref 80.0–100.0)
Platelets: 208 10*3/uL (ref 150–400)
RBC: 5 MIL/uL (ref 3.87–5.11)
RDW: 15.4 % (ref 11.5–15.5)
WBC: 9.4 10*3/uL (ref 4.0–10.5)
nRBC: 0 % (ref 0.0–0.2)

## 2021-05-05 LAB — GLUCOSE, CAPILLARY
Glucose-Capillary: 112 mg/dL — ABNORMAL HIGH (ref 70–99)
Glucose-Capillary: 96 mg/dL (ref 70–99)
Glucose-Capillary: 119 mg/dL — ABNORMAL HIGH (ref 70–99)
Glucose-Capillary: 85 mg/dL (ref 70–99)
Glucose-Capillary: 98 mg/dL (ref 70–99)

## 2021-05-05 LAB — GC/CHLAMYDIA PROBE AMP (~~LOC~~) NOT AT ARMC
Chlamydia: NEGATIVE
Comment: NEGATIVE
Comment: NORMAL
Neisseria Gonorrhea: NEGATIVE

## 2021-05-05 MED ORDER — PENICILLIN G POT IN DEXTROSE 60000 UNIT/ML IV SOLN
3.0000 10*6.[IU] | INTRAVENOUS | Status: DC
  Administered 2021-05-05: 3 10*6.[IU] via INTRAVENOUS
  Filled 2021-05-05: qty 50

## 2021-05-05 MED ORDER — LACTATED RINGERS IV SOLN
500.0000 mL | INTRAVENOUS | Status: DC | PRN
Start: 2021-05-05 — End: 2021-05-06

## 2021-05-05 MED ORDER — EPHEDRINE 5 MG/ML INJ: 10.0000 mg | INTRAVENOUS | Status: DC | PRN

## 2021-05-05 MED ORDER — OXYTOCIN-SODIUM CHLORIDE 30-0.9 UT/500ML-% IV SOLN
2.5000 [IU]/h | INTRAVENOUS | Status: DC
  Administered 2021-05-05: 2.5 [IU]/h via INTRAVENOUS
  Filled 2021-05-05: qty 500

## 2021-05-05 MED ORDER — SOD CITRATE-CITRIC ACID 500-334 MG/5ML PO SOLN: 30.0000 mL | ORAL | Status: DC | PRN

## 2021-05-05 MED ORDER — ACETAMINOPHEN 325 MG PO TABS: 650.0000 mg | ORAL_TABLET | ORAL | Status: DC | PRN

## 2021-05-05 MED ORDER — FENTANYL-BUPIVACAINE-NACL 0.5-0.125-0.9 MG/250ML-% EP SOLN
12.0000 mL/h | EPIDURAL | Status: DC | PRN
  Administered 2021-05-05: 12 mL/h via EPIDURAL
  Filled 2021-05-05: qty 250

## 2021-05-05 MED ORDER — DIPHENHYDRAMINE HCL 50 MG/ML IJ SOLN: 12.5000 mg | INTRAMUSCULAR | Status: DC | PRN

## 2021-05-05 MED ORDER — LACTATED RINGERS IV SOLN: 500.0000 mL | Freq: Once | INTRAVENOUS | Status: DC

## 2021-05-05 MED ORDER — FENTANYL CITRATE (PF) 100 MCG/2ML IJ SOLN
50.0000 ug | INTRAMUSCULAR | Status: DC | PRN
  Administered 2021-05-05: 100 ug via INTRAVENOUS
  Filled 2021-05-05: qty 2

## 2021-05-05 MED ORDER — OXYTOCIN BOLUS FROM INFUSION
333.0000 mL | Freq: Once | INTRAVENOUS | Status: AC
  Administered 2021-05-05: 333 mL via INTRAVENOUS

## 2021-05-05 MED ORDER — ONDANSETRON HCL 4 MG/2ML IJ SOLN: 4.0000 mg | Freq: Four times a day (QID) | INTRAMUSCULAR | Status: DC | PRN

## 2021-05-05 MED ORDER — LIDOCAINE HCL (PF) 1 % IJ SOLN
INTRAMUSCULAR | Status: DC | PRN
  Administered 2021-05-05: 3 mL via EPIDURAL

## 2021-05-05 MED ORDER — OXYCODONE-ACETAMINOPHEN 5-325 MG PO TABS: 2.0000 | ORAL_TABLET | ORAL | Status: DC | PRN

## 2021-05-05 MED ORDER — LIDOCAINE HCL (PF) 1 % IJ SOLN: 30.0000 mL | INTRAMUSCULAR | Status: DC | PRN

## 2021-05-05 MED ORDER — PHENYLEPHRINE 40 MCG/ML (10ML) SYRINGE FOR IV PUSH (FOR BLOOD PRESSURE SUPPORT)
80.0000 ug | PREFILLED_SYRINGE | INTRAVENOUS | Status: AC | PRN
  Administered 2021-05-05 (×3): 80 ug via INTRAVENOUS

## 2021-05-05 MED ORDER — LACTATED RINGERS IV SOLN: INTRAVENOUS | Status: DC

## 2021-05-05 MED ORDER — SODIUM CHLORIDE 0.9 % IV SOLN
5.0000 10*6.[IU] | Freq: Once | INTRAVENOUS | Status: AC
  Administered 2021-05-05: 5 10*6.[IU] via INTRAVENOUS
  Filled 2021-05-05: qty 5

## 2021-05-05 MED ORDER — INSULIN DETEMIR 100 UNIT/ML ~~LOC~~ SOLN
25.0000 [IU] | Freq: Every day | SUBCUTANEOUS | Status: DC
  Administered 2021-05-05: 25 [IU] via SUBCUTANEOUS
  Filled 2021-05-05: qty 0.25

## 2021-05-05 MED ORDER — LIDOCAINE-EPINEPHRINE (PF) 1.5 %-1:200000 IJ SOLN
INTRAMUSCULAR | Status: DC | PRN
  Administered 2021-05-05: 5 mL via PERINEURAL

## 2021-05-05 MED ORDER — PHENYLEPHRINE 40 MCG/ML (10ML) SYRINGE FOR IV PUSH (FOR BLOOD PRESSURE SUPPORT)
80.0000 ug | PREFILLED_SYRINGE | INTRAVENOUS | Status: DC | PRN
  Filled 2021-05-05: qty 10

## 2021-05-05 MED ORDER — OXYCODONE-ACETAMINOPHEN 5-325 MG PO TABS: 1.0000 | ORAL_TABLET | ORAL | Status: DC | PRN

## 2021-05-05 NOTE — Progress Notes (Signed)
Labor Progress Note Olivia Mccall is a 30 y.o. [id number] at [redacted]w[redacted]d presented for PPROM, now feeling strong contractions every 3 minutes.  S: resting comfortably with epidural. No questions of concerns at this time.   O:  BP (!) 118/59   Pulse (!) 122   Temp 98 F (36.7 C) (Oral)   Resp 16   Ht 5\' 1"  (1.549 m)   Wt 120 kg   SpO2 99%   BMI 49.98 kg/m  EFM: 145/moderate/accels present  CVE: Dilation: 5 Effacement (%): 80 Station: -1 Exam by:: Alexis [email or web address] RN   A&P: 30 y.o. 37 [redacted]w[redacted]d PPROM now with contractions.  #Labor: Patient's cervix has dilated since last check two days ago. Question whether this represents onset of labor with painful contractions vs. Gradual change. Will manage expectantly for now. Per patient, first two children were born with short labors.   #Pain: PRN, open to epidural   #FWB: Cat I #GBS  unknown, PCN  #A2GDM: On Levemir 50u daily at home. CBGs today 85-119. Will continue Levemir at decreased dose as she will not be eating on L&D. Baby measured 63%ile on most recent scan 9/2.   11/2, MD 10:12 PM

## 2021-05-05 NOTE — Anesthesia Procedure Notes (Signed)
Epidural Patient location during procedure: OB Start time: 05/05/2021 7:38 PM End time: 05/05/2021 7:50 PM  Staffing Anesthesiologist: Lucretia Kern, MD Performed: anesthesiologist   Preanesthetic Checklist Completed: patient identified, IV checked, risks and benefits discussed, monitors and equipment checked, pre-op evaluation and timeout performed  Epidural Patient position: sitting Prep: DuraPrep Patient monitoring: heart rate, continuous pulse ox and blood pressure Approach: midline Location: L3-L4 Injection technique: LOR saline  Needle:  Needle type: Tuohy  Needle gauge: 18 G Needle length: 9 cm Needle insertion depth: 7 cm Catheter type: closed end Catheter size: 20 Guage Catheter at skin depth: 12 cm Test dose: negative and 1.5% lidocaine with Epi 1:200 K  Assessment Events: blood not aspirated, injection not painful, no injection resistance, no paresthesia and negative IV test  Additional Notes Reason for block:procedure for pain

## 2021-05-05 NOTE — Progress Notes (Signed)
Labor Progress Note KALEB LINQUIST is a 30 y.o. [id number] at [redacted]w[redacted]d presented for PPROM, now feeling strong contractions every 3 minutes.  S: Feeling contractions more strongly. Breathing through them but unable to speak during contraction.   O:  BP 127/77   Pulse (!) 121   Temp 98 F (36.7 C) (Oral)   Resp 16   Ht 5\' 1"  (1.549 m)   Wt 120 kg   SpO2 100%   BMI 49.98 kg/m  EFM: 145/moderate/accels present  CVE: Dilation: 3 Effacement (%): 70 Station: -2 Exam by:: Dr. [email or web address]   A&P: 30 y.o. 37 [redacted]w[redacted]d PPROM now with contractions.  #Labor: Patient's cervix has dilated since last check two days ago. Question whether this represents onset of labor with painful contractions vs. Gradual change. Will manage expectantly for now. Per patient, first two children were born with short labors.   #Pain: PRN, open to epidural   #FWB: Cat I #GBS  unknown, PCN  #A2GDM: On Levemir 50u daily at home. CBGs today 85-119. Will continue Levemir at decreased dose as she will not be eating on L&D. Baby measured 63%ile on most recent scan 9/2.   11/2, MD 5:15 PM

## 2021-05-05 NOTE — Progress Notes (Signed)
FACULTY PRACTICE ANTEPARTUM PROGRESS NOTE  Olivia Mccall is a 30 y.o. [id number] at [redacted]w[redacted]d who is admitted for PROM.  Estimated Date of Delivery: 06/10/21 Fetal presentation is cephalic.  Length of Stay:  1 Days. Admitted 05/04/2021  Subjective: Pt seen.  Pt now requests induction of labor.  I have inquired with nursing staff and midnight 9/23 is likely the earliest that can be accommodated for elective induction.  Pt was was previously offered induction but declined.  She denies abdominal pain or fever/chills. Patient reports normal fetal movement.  She notes occasional irregular uterine contractions q 10-15, denies bleeding. Pt notes continued leaking of fluid per vagina.  Vitals:  Blood pressure (!) 117/58, pulse (!) 106, temperature 98.1 F (36.7 C), temperature source Oral, resp. rate 16, height 5\' 1"  (1.549 m), weight 120 kg, SpO2 96 %, unknown if currently breastfeeding. Physical Examination: CONSTITUTIONAL: Well-developed, well-nourished female in no acute distress.  HENT:  Normocephalic, atraumatic, External right and left ear normal. Oropharynx is clear and moist EYES: Conjunctivae and EOM are normal.  NECK: Normal range of motion, supple, no masses. SKIN: Skin is warm and dry. No rash noted. Not diaphoretic. No erythema. No pallor. NEUROLGIC: Alert and oriented to person, place, and time. Normal reflexes, muscle tone coordination. No cranial nerve deficit noted. PSYCHIATRIC: Normal mood and affect. Normal behavior. Normal judgment and thought content. CARDIOVASCULAR: Normal heart rate noted, regular rhythm RESPIRATORY: Effort and breath sounds normal, no problems with respiration noted MUSCULOSKELETAL: Normal range of motion. No edema and no tenderness. ABDOMEN: Soft, nontender, nondistended, gravid. CERVIX: deferred  Fetal monitoring: FHR: 130s bpm, Variability: moderate, Accelerations: Present, Decelerations: Absent  Uterine activity: uterine irritability   Results for  orders placed or performed during the hospital encounter of 05/04/21 (from the past 48 hour(s))  Urinalysis, Routine w reflex microscopic Urine, Clean Catch     Status: Abnormal   Collection Time: 05/04/21 11:59 AM  Result Value Ref Range   Color, Urine YELLOW YELLOW    Comment: LESS THAN 10 mL OF URINE SUBMITTED MICROSCOPIC EXAM PERFORMED ON UNCONCENTRATED URINE    APPearance CLEAR CLEAR   Specific Gravity, Urine 1.020 1.005 - 1.030   pH 7.0 5.0 - 8.0   Glucose, UA 100 (A) NEGATIVE mg/dL   Hgb urine dipstick TRACE (A) NEGATIVE   Bilirubin Urine NEGATIVE NEGATIVE   Ketones, ur NEGATIVE NEGATIVE mg/dL   Protein, ur 30 (A) NEGATIVE mg/dL   Nitrite NEGATIVE NEGATIVE   Leukocytes,Ua NEGATIVE NEGATIVE    Comment: Performed at [facility], [address] [place], [place]  Urinalysis, Microscopic (reflex)     Status: Abnormal   Collection Time: 05/04/21 11:59 AM  Result Value Ref Range   RBC / HPF 0-5 0 - 5 RBC/hpf   WBC, UA 0-5 0 - 5 WBC/hpf   Bacteria, UA RARE (A) NONE SEEN   Squamous Epithelial / LPF 0-5 0 - 5    Comment: Performed at [facility], [address] [place], [place]  Wet prep, genital     Status: Abnormal   Collection Time: 05/04/21  1:26 PM  Result Value Ref Range   Yeast Wet Prep HPF POC NONE SEEN NONE SEEN   Trich, Wet Prep NONE SEEN NONE SEEN   Clue Cells Wet Prep HPF POC NONE SEEN NONE SEEN   WBC, Wet Prep HPF POC MANY (A) NONE SEEN   Sperm NONE SEEN     Comment: Performed at [facility]  Lab, [address], [place], [place] [address]  Culture, beta strep (group b only)     Status: None (Preliminary result)   Collection Time: 05/04/21  1:26 PM   Specimen: Vaginal/Rectal; Genital  Result Value Ref Range   Specimen Description VAGINAL/RECTAL    Special Requests NONE    Culture      CULTURE REINCUBATED FOR BETTER GROWTH Performed at [facility], [address] [place], [place] [address]    Report Status PENDING    Type and screen [facility]     Status: None   Collection Time: 05/04/21  2:05 PM  Result Value Ref Range   ABO/RH(D) B POS    Antibody Screen NEG    Sample Expiration      05/07/2021,2359 Performed at [facility], [address] [place], [place] [address]   Resp Panel by RT-PCR (Flu A&B, Covid) Nasopharyngeal Swab     Status: None   Collection Time: 05/04/21  2:07 PM   Specimen: Nasopharyngeal Swab; Nasopharyngeal(NP) swabs in vial transport medium  Result Value Ref Range   SARS Coronavirus 2 by RT PCR NEGATIVE NEGATIVE    Comment: (NOTE) SARS-CoV-2 target nucleic acids are NOT DETECTED.  The SARS-CoV-2 RNA is generally detectable in upper respiratory specimens during the acute phase of infection. The lowest concentration of SARS-CoV-2 viral copies this assay can detect is 138 copies/mL. A negative result does not preclude SARS-Cov-2 infection and should not be used as the sole basis for treatment or other patient management decisions. A negative result may occur with  improper specimen collection/handling, submission of specimen other than nasopharyngeal swab, presence of viral mutation(s) within the areas targeted by this assay, and inadequate number of viral copies(<138 copies/mL). A negative result must be combined with clinical observations, patient history, and epidemiological information. The expected result is Negative.  Fact Sheet for Patients:  BloggerCourse.com  Fact Sheet for Healthcare Providers:  SeriousBroker.it  This test is no t yet approved or cleared by the [place] FDA and  has been authorized for detection and/or diagnosis of SARS-CoV-2 by FDA under an Emergency Use Authorization (EUA). This EUA will remain  in effect (meaning this test can be used) for the duration of the COVID-19 declaration under Section 564(b)(1) of the Act, 21 U.S.C.section 360bbb-3(b)(1), unless the  authorization is terminated  or revoked sooner.       Influenza A by PCR NEGATIVE NEGATIVE   Influenza B by PCR NEGATIVE NEGATIVE    Comment: (NOTE) The Xpert Xpress SARS-CoV-2/FLU/RSV plus assay is intended as an aid in the diagnosis of influenza from Nasopharyngeal swab specimens and should not be used as a sole basis for treatment. Nasal washings and aspirates are unacceptable for Xpert Xpress SARS-CoV-2/FLU/RSV testing.  Fact Sheet for Patients: BloggerCourse.com  Fact Sheet for Healthcare Providers: SeriousBroker.it  This test is not yet approved or cleared by the [place] FDA and has been authorized for detection and/or diagnosis of SARS-CoV-2 by FDA under an Emergency Use Authorization (EUA). This EUA will remain in effect (meaning this test can be used) for the duration of the COVID-19 declaration under Section 564(b)(1) of the Act, 21 U.S.C. section 360bbb-3(b)(1), unless the authorization is terminated or revoked.  Performed at [facility], [address], [place], [place] [address]   CBC on admission     Status: Abnormal   Collection Time: 05/04/21  3:31 PM  Result Value Ref Range   WBC 6.8 4.0 -  10.5 K/uL   RBC 4.87 3.87 - 5.11 MIL/uL   Hemoglobin 12.0 12.0 - 15.0 g/dL   HCT 65.0 35.4 - 65.6 %   MCV 77.2 (L) 80.0 - 100.0 fL   MCH 24.6 (L) 26.0 - 34.0 pg   MCHC 31.9 30.0 - 36.0 g/dL   RDW 81.2 75.1 - 70.0 %   Platelets 189 150 - 400 K/uL   nRBC 0.0 0.0 - 0.2 %    Comment: Performed at [facility], [address] [place], [place] [address]  Comprehensive metabolic panel     Status: Abnormal   Collection Time: 05/04/21  3:31 PM  Result Value Ref Range   Sodium 137 135 - 145 mmol/L   Potassium 4.4 3.5 - 5.1 mmol/L   Chloride 107 98 - 111 mmol/L   CO2 22 22 - 32 mmol/L   Glucose, Bld 84 70 - 99 mg/dL    Comment: Glucose reference range applies only to samples taken after fasting for at  least 8 hours.   BUN <5 (L) 6 - 20 mg/dL   Creatinine, Ser 4.96 0.44 - 1.00 mg/dL   Calcium 9.4 8.9 - 75.9 mg/dL   Total Protein 6.5 6.5 - 8.1 g/dL   Albumin 2.8 (L) 3.5 - 5.0 g/dL   AST 21 15 - 41 U/L   ALT 26 0 - 44 U/L   Alkaline Phosphatase 112 38 - 126 U/L   Total Bilirubin 0.6 0.3 - 1.2 mg/dL   GFR, Estimated >16 >38 mL/min    Comment: (NOTE) Calculated using the CKD-EPI Creatinine Equation (2021)    Anion gap 8 5 - 15    Comment: Performed at [facility], [address] [place], [place] [address]  POCT fern test     Status: None   Collection Time: 05/04/21  4:10 PM  Result Value Ref Range   POCT Fern Test Positive = ruptured amniotic membanes   Glucose, capillary     Status: Abnormal   Collection Time: 05/04/21  5:56 PM  Result Value Ref Range   Glucose-Capillary 132 (H) 70 - 99 mg/dL    Comment: Glucose reference range applies only to samples taken after fasting for at least 8 hours.  Glucose, capillary     Status: Abnormal   Collection Time: 05/04/21  9:11 PM  Result Value Ref Range   Glucose-Capillary 113 (H) 70 - 99 mg/dL    Comment: Glucose reference range applies only to samples taken after fasting for at least 8 hours.  Glucose, capillary     Status: Abnormal   Collection Time: 05/04/21 11:23 PM  Result Value Ref Range   Glucose-Capillary 131 (H) 70 - 99 mg/dL    Comment: Glucose reference range applies only to samples taken after fasting for at least 8 hours.  Glucose, capillary     Status: Abnormal   Collection Time: 05/05/21  4:47 AM  Result Value Ref Range   Glucose-Capillary 112 (H) 70 - 99 mg/dL    Comment: Glucose reference range applies only to samples taken after fasting for at least 8 hours.  Glucose, capillary     Status: None   Collection Time: 05/05/21  7:42 AM  Result Value Ref Range   Glucose-Capillary 96 70 - 99 mg/dL    Comment: Glucose reference range applies only to samples taken after fasting for at least 8 hours.  Glucose,  capillary     Status: Abnormal   Collection Time: 05/05/21 10:48 AM  Result Value Ref Range   Glucose-Capillary 119 (H) 70 - 99 mg/dL    Comment: Glucose reference range applies only to samples taken after fasting for at least 8 hours.   Comment 1 Notify RN    Comment 2 Document in Chart     I have reviewed the patient's current medications.  ASSESSMENT: Active Problems:   PROM with onset of labor more than 24 hours following rupture   PLAN: Fetal testing is reassuring.  Pt scheduled for IOL at her request on 9/23 due to floor availability and staffing.  If pt begins to actively labor, will transfer to L and D for labor  GBS is pending  Continue routine antenatal care.   Mariel Aloe, MD [facility] Faculty Attending, Center for [facility] 05/05/2021 2:24 PM

## 2021-05-05 NOTE — Progress Notes (Signed)
Shortly after my initial evaluation on 05/05/21 the patient's contractions intensified and she was more uncomfortable.  As she has PROM and now desires delivery, the patient was discussed with the L and D charge nurse and the current Faculty OB attending on L and D.  Pt will be moved to L and D for labor and pitocin augmentation if needed.  GBS status has not resulted so patient will receive GBS prophylaxis.     Mariel Aloe, MD Faculty attending Center for [employer].

## 2021-05-05 NOTE — Progress Notes (Signed)
Bedside US done - Vertex confirmed.

## 2021-05-05 NOTE — Lactation Note (Signed)
This note was copied from a baby's chart. Lactation Consultation Note  Patient Name: Boy Klynn Linnemann [id number] Date: 05/05/2021   Age:30 days RN on ( L&D) will call LC when mom is ready for latch assistance infant is under warmer due to low temps. Maternal Data    Feeding    LATCH Score                    Lactation Tools Discussed/Used    Interventions    Discharge    Consult Status      Danelle Earthly 05/05/2021, 11:38 PM

## 2021-05-05 NOTE — Progress Notes (Signed)
Labor Progress Note Olivia Mccall is a 30 y.o. [id number] at [redacted]w[redacted]d who presented for PPROM, now in spontaneous labor.   S: Doing well, no concerns. Family at bedside.  O:  BP (!) 118/59   Pulse (!) 122   Temp 98 F (36.7 C) (Oral)   Resp 16   Ht 5\' 1"  (1.549 m)   Wt 120 kg   SpO2 99%   BMI 49.98 kg/m   EFM: Baseline 135 bpm/moderate variability/+ accels/variable decels Contractions: Every 2 minutes  CVE: Dilation: 5 Effacement (%): 80 Station: -1 Exam by:: Alexis [email or web address] RN   A&P: 30 y.o. 37 [redacted]w[redacted]d   #Labor: Progressing well and contracting regularly. Will continue expectant management. #Pain: Epidural  #FWB: Cat 2 due to variable decels. Reassuring variability and continues to have accels. Will continue to monitor and consider IUPC placement for amnioinfusion if variable decels become more frequent/recurrent.  #GBS unknown; PCN  #A2GDM: On Levemir 50 units daily at home. CBGs today within normal limits. Levemir decreased to 25 units. EFW 63%ile on 9/2. Will continue to monitor with Q4 hour glucose checks while in latent labor. Q2 hr checks in active labor.  11/2, MD 10:13 PM

## 2021-05-05 NOTE — Anesthesia Preprocedure Evaluation (Signed)
Anesthesia Evaluation  Patient identified by MRN, date of birth, ID band Patient awake    Reviewed: Allergy & Precautions, H&P , NPO status , Patient's Chart, lab work & pertinent test results  History of Anesthesia Complications Negative for: history of anesthetic complications  Airway Mallampati: II  TM Distance: >3 FB     Dental   Pulmonary neg pulmonary ROS,    Pulmonary exam normal        Cardiovascular negative cardio ROS   Rhythm:regular Rate:Normal     Neuro/Psych negative neurological ROS  negative psych ROS   GI/Hepatic negative GI ROS, Neg liver ROS,   Endo/Other  diabetes, Gestational, Insulin DependentMorbid obesity  Renal/GU      Musculoskeletal   Abdominal   Peds  Hematology negative hematology ROS (+)   Anesthesia Other Findings   Reproductive/Obstetrics (+) Pregnancy                             Anesthesia Physical Anesthesia Plan  ASA: 3  Anesthesia Plan: Epidural   Post-op Pain Management:    Induction:   PONV Risk Score and Plan:   Airway Management Planned:   Additional Equipment:   Intra-op Plan:   Post-operative Plan:   Informed Consent: I have reviewed the patients History and Physical, chart, labs and discussed the procedure including the risks, benefits and alternatives for the proposed anesthesia with the patient or authorized representative who has indicated his/her understanding and acceptance.       Plan Discussed with:   Anesthesia Plan Comments:         Anesthesia Quick Evaluation

## 2021-05-05 NOTE — Progress Notes (Signed)
FACULTY PRACTICE ANTEPARTUM COMPREHENSIVE PROGRESS NOTE  Olivia Mccall is a 30 y.o. [id number] at [redacted]w[redacted]d who is admitted for PPROM - declines active management.  Estimated Date of Delivery: 06/10/21 Fetal presentation is cephalic.  Length of Stay:  1 Days. Admitted 05/04/2021  Subjective: She feels well this morning. Some mild cramps but no consistent pain.   Patient reports good fetal movement.  She reports minimal uterine contractions, no bleeding.  Vitals:  Blood pressure (!) 120/55, pulse 86, temperature 98 F (36.7 C), temperature source Oral, resp. rate 16, height 5\' 1"  (1.549 m), weight 120 kg, SpO2 100 %, unknown if currently breastfeeding. Physical Examination: CONSTITUTIONAL: Well-developed, well-nourished female in no acute distress.  NEUROLOGIC: Alert and oriented to person, place, and time. No cranial nerve deficit noted. PSYCHIATRIC: Normal mood and affect. Normal behavior. Normal judgment and thought content. CARDIOVASCULAR: Normal heart rate noted, regular rhythm RESPIRATORY: Effort and breath sounds normal, no problems with respiration noted MUSCULOSKELETAL: Normal range of motion. No edema and no tenderness. 2+ distal pulses. ABDOMEN: Soft, nontender, nondistended, gravid. CERVIX: Dilation: 1 Effacement (%): Thick Station: Ballotable Exam by:: erin lawrence np  Fetal monitoring: FHR: 140 bpm, Variability: moderate, Accelerations: Present, Decelerations: Absent  Uterine activity: irregular contractions  Results for orders placed or performed during the hospital encounter of 05/04/21 (from the past 48 hour(s))  Urinalysis, Routine w reflex microscopic Urine, Clean Catch     Status: Abnormal   Collection Time: 05/04/21 11:59 AM  Result Value Ref Range   Color, Urine YELLOW YELLOW    Comment: LESS THAN 10 mL OF URINE SUBMITTED MICROSCOPIC EXAM PERFORMED ON UNCONCENTRATED URINE    APPearance CLEAR CLEAR   Specific Gravity, Urine 1.020 1.005 - 1.030   pH 7.0 5.0 -  8.0   Glucose, UA 100 (A) NEGATIVE mg/dL   Hgb urine dipstick TRACE (A) NEGATIVE   Bilirubin Urine NEGATIVE NEGATIVE   Ketones, ur NEGATIVE NEGATIVE mg/dL   Protein, ur 30 (A) NEGATIVE mg/dL   Nitrite NEGATIVE NEGATIVE   Leukocytes,Ua NEGATIVE NEGATIVE    Comment: Performed at [facility], [address] [place], [place]  Urinalysis, Microscopic (reflex)     Status: Abnormal   Collection Time: 05/04/21 11:59 AM  Result Value Ref Range   RBC / HPF 0-5 0 - 5 RBC/hpf   WBC, UA 0-5 0 - 5 WBC/hpf   Bacteria, UA RARE (A) NONE SEEN   Squamous Epithelial / LPF 0-5 0 - 5    Comment: Performed at [facility], [address] [place], [place]  Wet prep, genital     Status: Abnormal   Collection Time: 05/04/21  1:26 PM  Result Value Ref Range   Yeast Wet Prep HPF POC NONE SEEN NONE SEEN   Trich, Wet Prep NONE SEEN NONE SEEN   Clue Cells Wet Prep HPF POC NONE SEEN NONE SEEN   WBC, Wet Prep HPF POC MANY (A) NONE SEEN   Sperm NONE SEEN     Comment: Performed at [facility], [address], [place], [place]  Type and screen [facility]     Status: None   Collection Time: 05/04/21  2:05 PM  Result Value Ref Range   ABO/RH(D) B POS    Antibody Screen NEG    Sample Expiration      05/07/2021,2359 Performed at [facility], [address], [place], [place]   Resp Panel by RT-PCR (  Flu A&B, Covid) Nasopharyngeal Swab     Status: None   Collection Time: 05/04/21  2:07 PM   Specimen: Nasopharyngeal Swab; Nasopharyngeal(NP) swabs in vial transport medium  Result Value Ref Range   SARS Coronavirus 2 by RT PCR NEGATIVE NEGATIVE    Comment: (NOTE) SARS-CoV-2 target nucleic acids are NOT DETECTED.  The SARS-CoV-2 RNA is generally detectable in upper respiratory specimens during the acute phase of infection. The lowest concentration of SARS-CoV-2 viral copies this assay can detect is 138 copies/mL. A negative result  does not preclude SARS-Cov-2 infection and should not be used as the sole basis for treatment or other patient management decisions. A negative result may occur with  improper specimen collection/handling, submission of specimen other than nasopharyngeal swab, presence of viral mutation(s) within the areas targeted by this assay, and inadequate number of viral copies(<138 copies/mL). A negative result must be combined with clinical observations, patient history, and epidemiological information. The expected result is Negative.  Fact Sheet for Patients:  BloggerCourse.com  Fact Sheet for Healthcare Providers:  SeriousBroker.it  This test is no t yet approved or cleared by the [place] FDA and  has been authorized for detection and/or diagnosis of SARS-CoV-2 by FDA under an Emergency Use Authorization (EUA). This EUA will remain  in effect (meaning this test can be used) for the duration of the COVID-19 declaration under Section 564(b)(1) of the Act, 21 U.S.C.section 360bbb-3(b)(1), unless the authorization is terminated  or revoked sooner.       Influenza A by PCR NEGATIVE NEGATIVE   Influenza B by PCR NEGATIVE NEGATIVE    Comment: (NOTE) The Xpert Xpress SARS-CoV-2/FLU/RSV plus assay is intended as an aid in the diagnosis of influenza from Nasopharyngeal swab specimens and should not be used as a sole basis for treatment. Nasal washings and aspirates are unacceptable for Xpert Xpress SARS-CoV-2/FLU/RSV testing.  Fact Sheet for Patients: BloggerCourse.com  Fact Sheet for Healthcare Providers: SeriousBroker.it  This test is not yet approved or cleared by the [place] FDA and has been authorized for detection and/or diagnosis of SARS-CoV-2 by FDA under an Emergency Use Authorization (EUA). This EUA will remain in effect (meaning this test can be used) for the duration  of the COVID-19 declaration under Section 564(b)(1) of the Act, 21 U.S.C. section 360bbb-3(b)(1), unless the authorization is terminated or revoked.  Performed at [facility] Lab, [address], [place], [place] [address]   CBC on admission     Status: Abnormal   Collection Time: 05/04/21  3:31 PM  Result Value Ref Range   WBC 6.8 4.0 - 10.5 K/uL   RBC 4.87 3.87 - 5.11 MIL/uL   Hemoglobin 12.0 12.0 - 15.0 g/dL   HCT 69.6 29.5 - 28.4 %   MCV 77.2 (L) 80.0 - 100.0 fL   MCH 24.6 (L) 26.0 - 34.0 pg   MCHC 31.9 30.0 - 36.0 g/dL   RDW 13.2 44.0 - 10.2 %   Platelets 189 150 - 400 K/uL   nRBC 0.0 0.0 - 0.2 %    Comment: Performed at [facility], [address], [place], [place] [address]  Comprehensive metabolic panel     Status: Abnormal   Collection Time: 05/04/21  3:31 PM  Result Value Ref Range   Sodium 137 135 - 145 mmol/L   Potassium 4.4 3.5 - 5.1 mmol/L   Chloride 107 98 - 111 mmol/L   CO2 22 22 - 32 mmol/L   Glucose, Bld 84  70 - 99 mg/dL    Comment: Glucose reference range applies only to samples taken after fasting for at least 8 hours.   BUN <5 (L) 6 - 20 mg/dL   Creatinine, Ser 7.10 0.44 - 1.00 mg/dL   Calcium 9.4 8.9 - 62.6 mg/dL   Total Protein 6.5 6.5 - 8.1 g/dL   Albumin 2.8 (L) 3.5 - 5.0 g/dL   AST 21 15 - 41 U/L   ALT 26 0 - 44 U/L   Alkaline Phosphatase 112 38 - 126 U/L   Total Bilirubin 0.6 0.3 - 1.2 mg/dL   GFR, Estimated >94 >85 mL/min    Comment: (NOTE) Calculated using the CKD-EPI Creatinine Equation (2021)    Anion gap 8 5 - 15    Comment: Performed at [facility], [address] [place], [place] [address]  POCT fern test     Status: None   Collection Time: 05/04/21  4:10 PM  Result Value Ref Range   POCT Fern Test Positive = ruptured amniotic membanes   Glucose, capillary     Status: Abnormal   Collection Time: 05/04/21  5:56 PM  Result Value Ref Range   Glucose-Capillary 132 (H) 70 - 99 mg/dL    Comment: Glucose reference  range applies only to samples taken after fasting for at least 8 hours.  Glucose, capillary     Status: Abnormal   Collection Time: 05/04/21  9:11 PM  Result Value Ref Range   Glucose-Capillary 113 (H) 70 - 99 mg/dL    Comment: Glucose reference range applies only to samples taken after fasting for at least 8 hours.  Glucose, capillary     Status: Abnormal   Collection Time: 05/04/21 11:23 PM  Result Value Ref Range   Glucose-Capillary 131 (H) 70 - 99 mg/dL    Comment: Glucose reference range applies only to samples taken after fasting for at least 8 hours.  Glucose, capillary     Status: Abnormal   Collection Time: 05/05/21  4:47 AM  Result Value Ref Range   Glucose-Capillary 112 (H) 70 - 99 mg/dL    Comment: Glucose reference range applies only to samples taken after fasting for at least 8 hours.    No results found.  Current scheduled medications  docusate sodium  100 mg Oral Daily   ferrous sulfate  325 mg Oral QODAY   insulin aspart  0-16 Units Subcutaneous TID PC   insulin detemir  50 Units Subcutaneous Q2000   prenatal multivitamin  1 tablet Oral Q1200    I have reviewed the patient's current medications.  ASSESSMENT: Active Problems:   PROM with onset of labor more than 24 hours following rupture T2DM  PLAN: PPROM - No evidence of infection at this time. WBC 6.8.  - Recommended delivery at 34wks but pt desires expectant management at this time. Will continue to reassess.  - GBS cx pending   T2DM - Continue Levemir. She has been declining short acting until she sees diabetes coordinator. She does not treat PP <140 and hers have been less than 140.  - Last growth was 9/2 and was 63%ile, AC was 69%ile. - Routine: TSH wnl, A1C 5.8 in beginning of pregnancy, Normal fetal echo in pregnancy.   FWB - Will do early NST today - NST yesterday reactive  4. Timing of delivery - Pt declines IOL at this time. Timing of delivery has been reviewed but she declines due to  poor experience with IOL in the past.  Recommended delivery at 34 wks, however given no evidence of infection at this time based on VS and labwork, expectant management is not unreasonable.  - Plan will be to reassess delivery timing tomorrow or 9/23   Continue routine antenatal care.   Milas Hock, MD, FACOG Obstetrician & Gynecologist, [facility] for [facility], [facility]

## 2021-05-05 NOTE — Progress Notes (Addendum)
Inpatient Diabetes Program Recommendations  Diabetes Treatment Program Recommendations  ADA Standards of Care 2018 Diabetes in Pregnancy Target Glucose Ranges:  Fasting: 60 - 90 mg/dL Preprandial: 60 - 027 mg/dL 1 hr postprandial: Less than 140mg /dL (from first bite of meal) 2 hr postprandial: Less than 120 mg/dL (from first bite of meal)      Lab Results  Component Value Date   GLUCAP 112 (H) 05/05/2021    Review of Glycemic Control Results for Olivia Mccall, Olivia Mccall (MRN Ray [facility]) as of 05/05/2021 07:39  Ref. Range 05/04/2021 21:11 05/04/2021 23:23 05/05/2021 04:47  Glucose-Capillary Latest Ref Range: 70 - 99 mg/dL 05/07/2021 (H) 474 (H) 259 (H)   Diabetes history: Type 2 DM Outpatient Diabetes medications: Levemir 50 units QHS Current orders for Inpatient glycemic control: Levemir 50 units QHS, Novolog 0-16 units TID  Inpatient Diabetes Program Recommendations:    Noted consult. Blood glucose trending mostly in target goal range. Noted patient refused correction insulin for CBG of 131 mg/dL. Will watch further trends to justify education and need for short acting insulin.  At this time, consider discontinuing Novolog 0-16 units TID.  Thanks, 563, MSN, RNC-OB Diabetes Coordinator [phone] (8a-5p)

## 2021-05-06 ENCOUNTER — Encounter (HOSPITAL_COMMUNITY): Payer: Self-pay | Admitting: Obstetrics and Gynecology

## 2021-05-06 DIAGNOSIS — O24419 Gestational diabetes mellitus in pregnancy, unspecified control: Secondary | ICD-10-CM

## 2021-05-06 DIAGNOSIS — Z8632 Personal history of gestational diabetes: Secondary | ICD-10-CM | POA: Insufficient documentation

## 2021-05-06 LAB — CULTURE, BETA STREP (GROUP B ONLY)

## 2021-05-06 LAB — RPR: RPR Ser Ql: NONREACTIVE

## 2021-05-06 LAB — GLUCOSE, CAPILLARY: Glucose-Capillary: 93 mg/dL (ref 70–99)

## 2021-05-06 MED ORDER — DIPHENHYDRAMINE HCL 25 MG PO CAPS: 25.0000 mg | ORAL_CAPSULE | Freq: Four times a day (QID) | ORAL | Status: DC | PRN

## 2021-05-06 MED ORDER — PRENATAL MULTIVITAMIN CH
1.0000 | ORAL_TABLET | Freq: Every day | ORAL | Status: DC
  Administered 2021-05-06 – 2021-05-07 (×2): 1 via ORAL
  Filled 2021-05-06 (×2): qty 1

## 2021-05-06 MED ORDER — ACETAMINOPHEN 325 MG PO TABS
650.0000 mg | ORAL_TABLET | ORAL | Status: DC | PRN
  Administered 2021-05-06 – 2021-05-07 (×7): 650 mg via ORAL
  Filled 2021-05-06 (×7): qty 2

## 2021-05-06 MED ORDER — ONDANSETRON HCL 4 MG PO TABS: 4.0000 mg | ORAL_TABLET | ORAL | Status: DC | PRN

## 2021-05-06 MED ORDER — IBUPROFEN 600 MG PO TABS
600.0000 mg | ORAL_TABLET | Freq: Four times a day (QID) | ORAL | Status: DC
  Administered 2021-05-06 – 2021-05-07 (×6): 600 mg via ORAL
  Filled 2021-05-06 (×6): qty 1

## 2021-05-06 MED ORDER — ZOLPIDEM TARTRATE 5 MG PO TABS: 5.0000 mg | ORAL_TABLET | Freq: Every evening | ORAL | Status: DC | PRN

## 2021-05-06 MED ORDER — BENZOCAINE-MENTHOL 20-0.5 % EX AERO
1.0000 "application " | INHALATION_SPRAY | CUTANEOUS | Status: DC | PRN
  Administered 2021-05-06: 1 via TOPICAL
  Filled 2021-05-06: qty 56

## 2021-05-06 MED ORDER — SIMETHICONE 80 MG PO CHEW: 80.0000 mg | CHEWABLE_TABLET | ORAL | Status: DC | PRN

## 2021-05-06 MED ORDER — DIBUCAINE (PERIANAL) 1 % EX OINT: 1.0000 "application " | TOPICAL_OINTMENT | CUTANEOUS | Status: DC | PRN

## 2021-05-06 MED ORDER — ONDANSETRON HCL 4 MG/2ML IJ SOLN: 4.0000 mg | INTRAMUSCULAR | Status: DC | PRN

## 2021-05-06 MED ORDER — WITCH HAZEL-GLYCERIN EX PADS: 1.0000 "application " | MEDICATED_PAD | CUTANEOUS | Status: DC | PRN

## 2021-05-06 MED ORDER — SENNOSIDES-DOCUSATE SODIUM 8.6-50 MG PO TABS
2.0000 | ORAL_TABLET | ORAL | Status: DC
  Administered 2021-05-06: 2 via ORAL
  Filled 2021-05-06: qty 2

## 2021-05-06 MED ORDER — TETANUS-DIPHTH-ACELL PERTUSSIS 5-2.5-18.5 LF-MCG/0.5 IM SUSY: 0.5000 mL | PREFILLED_SYRINGE | Freq: Once | INTRAMUSCULAR | Status: DC

## 2021-05-06 MED ORDER — COCONUT OIL OIL: 1.0000 "application " | TOPICAL_OIL | Status: DC | PRN

## 2021-05-06 NOTE — Progress Notes (Addendum)
Post Partum Day 1  Subjective: Patient feeling well this morning and ambulating around the room when I came in. She reports minimal pain well controlled with Tylenol and small amount of bleeding without clots.   Objective: Blood pressure 117/70, pulse 99, temperature 98.3 F (36.8 C), temperature source Oral, resp. rate 18, height 5\' 1"  (1.549 m), weight 120 kg, SpO2 100 %, unknown if currently breastfeeding.  Physical Exam:  General: alert, cooperative, and no distress Lochia: appropriate Uterine Fundus: firm DVT Evaluation: no LE edema or calf tenderness to palpation  Recent Labs    05/04/21 1531 05/05/21 1716  HGB 12.0 12.1  HCT 37.6 38.4    Assessment/Plan: Plan for discharge tomorrow Breast feeding, has lactation consult this morning Needs circ prior to discharge, consented Depo prior to discharge   LOS: 2 days   05/07/21 05/06/2021, 8:14 AM   GME ATTESTATION:  I saw and evaluated the patient. I agree with the findings and the plan of care as documented in the student's note. I have made changes to documentation as necessary.  Progressing well postpartum and meeting all milestones. Will assess fasting glucose tomorrow AM on 9/23. Consented for circumcision at bedside. Plan for discharge home tomorrow.  10/23, MD OB Fellow, Faculty [facility] 05/06/2021 8:55 AM

## 2021-05-06 NOTE — Lactation Note (Signed)
This note was copied from a baby's chart. Lactation Consultation Note  Patient Name: Olivia Mccall [id number] Date: 05/06/2021 Reason for consult: L&D Initial assessment;1st time breastfeeding;Late-preterm 34-36.6wks Age:30 years LC entered the room, mom was doing skin to skin with infant. Per mom, she did not breastfeed her 1st child but would like to breastfeed 2nd child. LC discuss infant feeding behaviors for LPTI and mom would like to use donor breast milk to supplement infant when on MBU after latching infant at the breast. Mom plans to use the DEBP every 3 hours for 15 minutes on initial setting to help stimulate and establish her milk supply. Mom latched infant on her left  breast using the football hold position, infant sustain latch and breastfeed for 12 minutes but tires easily. Mom was taught hand expression and infant was given 6 mls of colostrum by spoon. Mom will ask RN/LC for latch assistance on MBU if needed. LC talk with MBU RN to set up DEBP so mom can pump and  for Mom to follow LPTI feeding policy green sheet.   Maternal Data Has patient been taught Hand Expression?: Yes Does the patient have breastfeeding experience prior to this delivery?: No  Feeding Mother's Current Feeding Choice: Breast Milk and Donor Milk  LATCH Score Latch: Grasps breast easily, tongue down, lips flanged, rhythmical sucking.  Audible Swallowing: A few with stimulation  Type of Nipple: Everted at rest and after stimulation (short shafted)  Comfort (Breast/Nipple): Soft / non-tender  Hold (Positioning): Assistance needed to correctly position infant at breast and maintain latch.  LATCH Score: 8   Lactation Tools Discussed/Used    Interventions Interventions: Breast feeding basics reviewed;Assisted with latch;Adjust position;Support pillows;Skin to skin;Breast compression;Position options;Breast massage;Hand express;Expressed milk;Pre-pump if needed;Education;DEBP  Discharge     Consult Status Consult Status: Follow-up Date: 05/06/21 Follow-up type: In-patient    Danelle Earthly 05/06/2021, 1:11 AM

## 2021-05-06 NOTE — Discharge Summary (Signed)
Postpartum Discharge Summary     Patient Name: Olivia Mccall DOB: [birth year] MRN: [id number]  Date of admission: 05/04/2021 Delivery date:05/05/2021  Delivering provider: Genia Del  Date of discharge: 05/07/2021  Admitting diagnosis: PROM with onset of labor more than 24 hours following rupture [O42.10] Intrauterine pregnancy: [redacted]w[redacted]d     Secondary diagnosis:  Principal Problem:   Vaginal delivery Active Problems:   PROM with onset of labor more than 24 hours following rupture   DM (diabetes mellitus), gestational  Additional problems: None    Discharge diagnosis: Preterm Pregnancy Delivered                                              Post partum procedures: None Augmentation: N/A Complications: None  Hospital course: Induction of Labor With Vaginal Delivery   30 y.o. yo [id number] at [redacted]w[redacted]d was admitted to the hospital 05/04/2021 for induction of labor.  Indication for induction:  PPROM .  Patient had an uncomplicated labor course as follows: Membrane Rupture Time/Date: 10:40 AM ,05/04/2021   Delivery Method:Vaginal, Spontaneous  Episiotomy: None  Lacerations:  None  Details of delivery can be found in separate delivery note.  Patient had a routine postpartum course. Patient is discharged home 05/07/21.  Newborn Data: Birth date:05/05/2021  Birth time:11:09 PM  Gender:Female  Living status:Living  Apgars:8 ,9  Weight:2659 g   Magnesium Sulfate received: No BMZ received: No Rhophylac: N/A MMR: N/A - Immune  T-DaP: Offered postpartum Flu: N/A Transfusion: No  Physical exam  Vitals:   05/06/21 1032 05/06/21 1442 05/06/21 2125 05/07/21 0516  BP: 118/67 113/61 127/74 (!) 103/58  Pulse: 89 85 88 93  Resp: $Remo'16 16 18 18  'DTPTH$ Temp: 98.9 F (37.2 C) 98 F (36.7 C) 98.3 F (36.8 C) 97.8 F (36.6 C)  TempSrc: Oral Oral Oral Oral  SpO2: 99% 99% 100% 99%  Weight:      Height:       General: alert, cooperative, and no distress Lochia: appropriate Uterine Fundus:  firm DVT Evaluation: no LE edema or calf tenderness to palpation  Labs: Lab Results  Component Value Date   WBC 9.4 05/05/2021   HGB 12.1 05/05/2021   HCT 38.4 05/05/2021   MCV 76.8 (L) 05/05/2021   PLT 208 05/05/2021   CMP Latest Ref Rng & Units 05/04/2021  Glucose 70 - 99 mg/dL 84  BUN 6 - 20 mg/dL <5(L)  Creatinine 0.44 - 1.00 mg/dL 0.75  Sodium 135 - 145 mmol/L 137  Potassium 3.5 - 5.1 mmol/L 4.4  Chloride 98 - 111 mmol/L 107  CO2 22 - 32 mmol/L 22  Calcium 8.9 - 10.3 mg/dL 9.4  Total Protein 6.5 - 8.1 g/dL 6.5  Total Bilirubin 0.3 - 1.2 mg/dL 0.6  Alkaline Phos 38 - 126 U/L 112  AST 15 - 41 U/L 21  ALT 0 - 44 U/L 26   Edinburgh Score: Edinburgh Postnatal Depression Scale Screening Tool 05/07/2021  I have been able to laugh and see the funny side of things. 0  I have looked forward with enjoyment to things. 0  I have blamed myself unnecessarily when things went wrong. 0  I have been anxious or worried for no good reason. 1  I have felt scared or panicky for no good reason. 0  Things have been getting on top of me. 0  I have been so unhappy that I have had difficulty sleeping. 0  I have felt sad or miserable. 0  I have been so unhappy that I have been crying. 0  The thought of harming myself has occurred to me. 0  Edinburgh Postnatal Depression Scale Total 1     After visit meds:  Allergies as of 05/07/2021       Reactions   Vancomycin    Lips swelling, generalized rash, felt  hot.         Medication List     STOP taking these medications    Accu-Chek Guide test strip Generic drug: glucose blood   Accu-Chek Softclix Lancets lancets   B-D ULTRAFINE III SHORT PEN 31G X 8 MM Misc Generic drug: Insulin Pen Needle   fluticasone 110 MCG/ACT inhaler Commonly known as: FLOVENT HFA   insulin detemir 100 UNIT/ML injection Commonly known as: LEVEMIR   Pentips 31G X 8 MM Misc Generic drug: Insulin Pen Needle       TAKE these medications     acetaminophen 500 MG tablet Commonly known as: TYLENOL Take 2 tablets (1,000 mg total) by mouth every 8 (eight) hours as needed.   AeroChamber MV inhaler Use as instructed   ferrous sulfate 325 (65 FE) MG tablet Take by mouth.   ibuprofen 600 MG tablet Commonly known as: ADVIL Take 1 tablet (600 mg total) by mouth every 6 (six) hours as needed (pain).   PRENATAL AD PO Take by mouth.         Discharge home in stable condition Infant Feeding: Breast Infant Disposition: home with mother Discharge instruction: per After Visit Summary and Postpartum booklet. Activity: Advance as tolerated. Pelvic rest for 6 weeks.  Diet: routine diet Future Appointments: Future Appointments  Date Time Provider [place]  06/15/2021  9:35 AM Burleson, Rona Ravens, NP [facility]   Follow up Visit: Patient would like to transfer care to [facility] Message sent to [facility] on 05/07/21 by Dr. Gwenlyn Perking.   Please schedule patient for in person postpartum visit in 4 weeks with any provider.  Additional Postpartum F/U: 2 hour GTT (fasting CBG postpartum 102) High risk pregnancy complicated by: GDM Delivery mode:  Vaginal, Spontaneous  Anticipated Birth Control:  Depo  05/07/2021 Genia Del, MD

## 2021-05-06 NOTE — Anesthesia Postprocedure Evaluation (Signed)
Anesthesia Post Note  Patient: Olivia Mccall  Procedure(s) Performed: AN AD HOC LABOR EPIDURAL     Patient location during evaluation: Mother Baby Anesthesia Type: Epidural Level of consciousness: awake and alert and oriented Pain management: satisfactory to patient Vital Signs Assessment: post-procedure vital signs reviewed and stable Respiratory status: respiratory function stable Cardiovascular status: stable Postop Assessment: no headache, no backache, epidural receding, patient able to bend at knees, no signs of nausea or vomiting, adequate PO intake and able to ambulate Anesthetic complications: no   No notable events documented.  Last Vitals:  Vitals:   05/06/21 0240 05/06/21 0631  BP: 128/64 117/70  Pulse: 98 99  Resp: 18 18  Temp: 36.6 C 36.8 C  SpO2: 99% 100%    Last Pain:  Vitals:   05/06/21 0710  TempSrc:   PainSc: 0-No pain   Pain Goal:                   Harman Ferrin

## 2021-05-06 NOTE — Lactation Note (Signed)
This note was copied from a baby's chart. Lactation Consultation Note  Patient Name: Boy Eliza Green [id number] Date: 05/06/2021 Reason for consult: Initial assessment;Late-preterm 34-36.6wks Age:30 hours  P3 mom pumping when LC entered room.  Mom is using 24 size flange.  27 were used but 24 is comfortable per mom.  Mom states her goal is to exclusively BF until she returns to work.  She has a wearable pump at home.    BF 11 yr. Old 2 months BF 30 yr old 6 months  LPTI behaviors and feeding guidelines reviewed with mom.  Encouraged STS, and hand expression after pumping.    All questions and concerns addressed.  Brochure provided with lactation resources.    Mom was encouraged to follow up with OP LC and join BFSG.    Maternal Data    Feeding Mother's Current Feeding Choice: Breast Milk and Donor Milk Nipple Type: Nfant Slow Flow (purple)  LATCH Score   Lactation Tools Discussed/Used Breast pump type: Double-Electric Breast Pump Pump Education: Setup, frequency, and cleaning Reason for Pumping: LPTI Pumping frequency: encouraged every 3 hours  Interventions Interventions: DEBP;Education;Breast feeding basics reviewed  Discharge Pump: DEBP WIC Program: Yes  Consult Status Consult Status: Follow-up Date: 05/07/21 Follow-up type: In-patient    Maryruth [facility]ancock [facility] 05/06/2021, 9:02 AM

## 2021-05-07 ENCOUNTER — Encounter (HOSPITAL_COMMUNITY): Payer: PRIVATE HEALTH INSURANCE

## 2021-05-07 LAB — GLUCOSE, CAPILLARY: Glucose-Capillary: 102 mg/dL — ABNORMAL HIGH (ref 70–99)

## 2021-05-07 MED ORDER — MEDROXYPROGESTERONE ACETATE 150 MG/ML IM SUSP: 150.0000 mg | INTRAMUSCULAR | Status: DC | PRN

## 2021-05-07 MED ORDER — IBUPROFEN 600 MG PO TABS: 600.0000 mg | ORAL_TABLET | Freq: Four times a day (QID) | ORAL | 0 refills | Status: AC | PRN

## 2021-05-07 MED ORDER — ACETAMINOPHEN 500 MG PO TABS: 1000.0000 mg | ORAL_TABLET | Freq: Three times a day (TID) | ORAL | 0 refills | Status: AC | PRN

## 2021-05-07 NOTE — Lactation Note (Signed)
This note was copied from a baby's chart. Lactation Consultation Note  Patient Name: Olivia Mccall [id number] Date: 05/07/2021 Reason for consult: Follow-up assessment;Late-preterm 34-36.6wks Age:30 hours  Follow up visit to 37 hours old LPT infant. Infant is getting ultrasound done. Mother reports pumping is going well, she is now collecting 5 mL per pumping session. Reinforced pumping every 3h.  Mother states infant is feeding well, aiming to 25 mL per feeding at the moment. Mother states she is following SLP recommendations regarding nipple, pace bottle-feeding technique, upright position and frequent burping.  Infant has been been having good voids and stool. Plan:  1-feeding on demand or 8-12 times in 24h period. 2-Pump every 3 hours and supplement following guidelines and paced bottle feeding. 3-Preserve infant energy limiting feeding sessions to 30 min max.  4-Encouraged maternal rest, hydration and food intake.   Contact LC as needed for feeds/support/concerns/questions. All questions answered at this time.   Feeding Mother's Current Feeding Choice: Breast Milk and Donor Milk Nipple Type: Slow - flow  Lactation Tools Discussed/Used Tools: Pump Breast pump type: Double-Electric Breast Pump Reason for Pumping: LPTI Pumping frequency: Encouraged every 3h Pumped volume: 5 mL  Interventions Interventions: Breast feeding basics reviewed;Skin to skin;Expressed milk;DEBP;Education;Pace feeding  Consult Status Consult Status: Follow-up Date: 05/08/21 Follow-up type: In-patient    Kanoelani Dobies A Higuera Ancidey 05/07/2021, 12:42 PM

## 2021-05-08 ENCOUNTER — Ambulatory Visit: Payer: Self-pay

## 2021-05-08 NOTE — Lactation Note (Signed)
This note was copied from a baby's chart. Lactation Consultation Note  Patient Name: Olivia Mccall CBULA'G Date: 05/08/2021 Reason for consult: Follow-up assessment;Late-preterm 34-36.6wks;Infant < 6lbs;Hyperbilirubinemia Age:30 hours  LC in to visit with P3 Mom of LPTI, baby at 4.9% weight loss and on single phototherapy.    Mom is pumping every 3 hrs and volume is enough to exclusively feed MOM, rather than donor breast milk.    Mom is very committed to pumping and supporting her milk supply.    Mom is feeding baby 30+ ml at least every 3 hrs.    No questions at this time.   Breast milk labels provided and reviewed washing of pump parts and provided separate bin to dry parts.  Mom to ask for help prn.  Lactation Tools Discussed/Used Tools: Pump;Bottle Breast pump type: Double-Electric Breast Pump Pump Education: Setup, frequency, and cleaning;Milk Storage Reason for Pumping: support full milk supply/LPTI Pumping frequency: Q 2-3 hrs Pumped volume: 45 mL  Interventions Interventions: Breast feeding basics reviewed;Skin to skin;Breast massage;Hand express;DEBP  Consult Status Consult Status: Follow-up Date: 05/09/21 Follow-up type: In-patient    Olivia Mccall 05/08/2021, 12:09 PM

## 2021-05-09 ENCOUNTER — Ambulatory Visit: Payer: Self-pay

## 2021-05-09 NOTE — Lactation Note (Addendum)
This note was copied from a baby's chart. Lactation Consultation Note  Patient Name: Olivia Mccall HRCBU'L Date: 05/09/2021 Reason for consult: Follow-up assessment;Infant < 6lbs;Late-preterm 34-36.6wks;Hyperbilirubinemia Age:30 days  LC in to visit with P4 Mom of LPTI.  Baby lost 20 gm since yesterday and is at 5.6% weight loss today.  Output is good.  Baby continues on phototherapy, bilirubin level today shows a decrease which is making Mom happy.   Baby is consistently drinking 30 ml by bottle at least every 3 hrs.  Mom has been trying to increase volume without success.  Mom is pumping and expressing 60 ml total.  EBM bottles in frig labeled as Mom was told to use up the donor breast milk before using her own milk.    Mom has a hand's free pumping bra at home and another pump from [facility].  Mom aware of [facility] loaner pump available if DC'd on the weekend.  Mom doesn't think they will be going home until tomorrow.  LC also recommended F/U with OP lactation consultation, message to clinic to be sent on discharge. Encouraged Mom to call WIC on Monday.  Mom denies any further questions.  Lactation Tools Discussed/Used Tools: Pump;Flanges;Bottle Breast pump type: Double-Electric Breast Pump  Interventions Interventions: Breast feeding basics reviewed;Skin to skin;Breast massage;Hand express;DEBP;Pace feeding  Discharge WIC Program: Yes  Consult Status Consult Status: Follow-up Date: 05/10/21 Follow-up type: In-patient    [name] 05/09/2021, 1:06 PM

## 2021-05-10 ENCOUNTER — Ambulatory Visit: Payer: Self-pay

## 2021-05-10 NOTE — Lactation Note (Signed)
This note was copied from a baby's chart. Lactation Consultation Note  Patient Name: Olivia Mccall Date: 05/10/2021 Reason for consult: Follow-up assessment;Mother's request;Late-preterm 34-36.6wks;Maternal endocrine disorder Age:30 days Infant completed phototherapy.Mom working on increasing volume as tolerated. Mom adequate pumped breast milk for feeding.   Infant maximum volume at 5 days 35 ml.  Mom has Elvie pump for home. Mom denied any pain with current 27 flanges. No nipple trauma noted.  Mom follow up appointment with Pediatrician in am. [facility] provided outpatient [facility] contact information for further LC support.   Mom to follow up with [facility] after discharge to get her DEBP. Mom to take home Medela pump parts.   LC examined breasts soft no evidence of plugged ducts or engorgment. All questions answered at the end of the visit.  Maternal Data    Feeding Mother's Current Feeding Choice: Breast Milk  LATCH Score                    Lactation Tools Discussed/Used Flange Size: 27 Breast pump type: Double-Electric Breast Pump Pump Education: Setup, frequency, and cleaning;Milk Storage Reason for Pumping: increase stimulation Pumping frequency: every 3 hrs for  Interventions Interventions: Breast feeding basics reviewed;DEBP;Education;Pace feeding  Discharge Discharge Education: Engorgement and breast care;Warning signs for feeding baby;Outpatient recommendation Pump: Personal  Consult Status Consult Status: Complete Date: 05/11/21 Follow-up type: In-patient    Chidinma Clites  Nicholson-Springer 05/10/2021, 1:11 PM

## 2021-05-17 ENCOUNTER — Telehealth (HOSPITAL_COMMUNITY): Payer: Self-pay | Admitting: *Deleted

## 2021-05-17 NOTE — Telephone Encounter (Signed)
Attempted hospital discharge follow-up call. Left message for patient to return RN call. Deforest Hoyles, RN, 05/17/21, [phone].

## 2021-06-15 ENCOUNTER — Ambulatory Visit: Payer: PRIVATE HEALTH INSURANCE | Admitting: Nurse Practitioner

## 2021-07-05 DIAGNOSIS — Z8659 Personal history of other mental and behavioral disorders: Secondary | ICD-10-CM | POA: Insufficient documentation

## 2021-07-05 DIAGNOSIS — F53 Postpartum depression: Secondary | ICD-10-CM | POA: Insufficient documentation

## 2021-07-05 DIAGNOSIS — Z8759 Personal history of other complications of pregnancy, childbirth and the puerperium: Secondary | ICD-10-CM | POA: Insufficient documentation

## 2021-07-05 DIAGNOSIS — F419 Anxiety disorder, unspecified: Secondary | ICD-10-CM | POA: Insufficient documentation

## 2021-07-05 DIAGNOSIS — F411 Generalized anxiety disorder: Secondary | ICD-10-CM | POA: Insufficient documentation

## 2021-09-06 DIAGNOSIS — F331 Major depressive disorder, recurrent, moderate: Secondary | ICD-10-CM | POA: Insufficient documentation

## 2021-09-06 DIAGNOSIS — F3341 Major depressive disorder, recurrent, in partial remission: Secondary | ICD-10-CM | POA: Insufficient documentation

## 2022-09-05 DIAGNOSIS — E669 Obesity, unspecified: Secondary | ICD-10-CM | POA: Insufficient documentation

## 2022-11-27 ENCOUNTER — Other Ambulatory Visit: Payer: Self-pay

## 2022-11-27 ENCOUNTER — Encounter (HOSPITAL_BASED_OUTPATIENT_CLINIC_OR_DEPARTMENT_OTHER): Payer: Self-pay

## 2022-11-27 ENCOUNTER — Emergency Department (HOSPITAL_BASED_OUTPATIENT_CLINIC_OR_DEPARTMENT_OTHER)
Admission: EM | Admit: 2022-11-27 | Discharge: 2022-11-27 | Disposition: A | Discharge status: Home / Self Care | Payer: Medicaid Other | Attending: Emergency Medicine | Admitting: Emergency Medicine

## 2022-11-27 ENCOUNTER — Emergency Department (HOSPITAL_BASED_OUTPATIENT_CLINIC_OR_DEPARTMENT_OTHER): Payer: Medicaid Other

## 2022-11-27 DIAGNOSIS — J020 Streptococcal pharyngitis: Secondary | ICD-10-CM | POA: Diagnosis not present

## 2022-11-27 DIAGNOSIS — J029 Acute pharyngitis, unspecified: Secondary | ICD-10-CM | POA: Diagnosis present

## 2022-11-27 LAB — GROUP A STREP BY PCR: Group A Strep by PCR: DETECTED — AB

## 2022-11-27 LAB — PREGNANCY, URINE: Preg Test, Ur: NEGATIVE

## 2022-11-27 MED ORDER — IBUPROFEN 400 MG PO TABS
600.0000 mg | ORAL_TABLET | Freq: Once | ORAL | Status: AC
  Administered 2022-11-27: 600 mg via ORAL
  Filled 2022-11-27: qty 1

## 2022-11-27 MED ORDER — AMOXICILLIN 500 MG PO CAPS: 500.0000 mg | ORAL_CAPSULE | Freq: Two times a day (BID) | ORAL | 0 refills | Status: AC

## 2022-11-27 MED ORDER — DEXAMETHASONE SODIUM PHOSPHATE 10 MG/ML IJ SOLN
10.0000 mg | Freq: Once | INTRAMUSCULAR | Status: AC
  Administered 2022-11-27: 10 mg via INTRAMUSCULAR
  Filled 2022-11-27: qty 1

## 2022-11-27 NOTE — ED Triage Notes (Signed)
Sore throat with mild shortness of breath with exertion.  Has past of history of pneumonia in 2020.

## 2022-11-27 NOTE — Discharge Instructions (Addendum)
It was a pleasure taking care of you today!  Your strep test was positive in the ED. you will be sent a prescription for amoxicillin, take as prescribed and ensure to complete the entire course of the antibiotic.  You may take over-the-counter 600 mg ibuprofen every 6 hours and alternate with 500 mg Tylenol every 6 hours as needed for pain for no more than 7 days.  Ensure to maintain fluid intake with tea, broth, soup, Gatorade, Pedialyte, water.  Ensure that you are practicing good hand hygiene.  You may follow-up with your primary care provider as needed.  Return to the emergency department if experiencing increasing/worsening trouble swallowing, trouble breathing, fever, worsening symptoms.

## 2022-11-27 NOTE — ED Provider Notes (Signed)
[facility] AT [facility] Provider Note   CSN: [id number] Arrival date & time: 11/27/22  4742     History  Chief Complaint  Patient presents with   Sore Throat    Olivia Mccall is a 32 y.o. female who presents emergency department with concerns for sore throat onset 5 days. Has associated painful swallowing. Has tried OTC medications for her symptoms. Denies sick contacts. Denies chest pain, shortness of breath, rhinorrhea, nasal congestion. No history of DM.    The history is provided by the patient. No language interpreter was used.       Home Medications Prior to Admission medications   Medication Sig Start Date End Date Taking? Authorizing Provider  amoxicillin (AMOXIL) 500 MG capsule Take 1 capsule (500 mg total) by mouth 2 (two) times daily for 10 days. 11/27/22 12/07/22 Yes Brooklyn Alfredo A, PA-C  acetaminophen (TYLENOL) 500 MG tablet Take 2 tablets (1,000 mg total) by mouth every 8 (eight) hours as needed. 05/07/21   Worthy Rancher, MD  ferrous sulfate 325 (65 FE) MG tablet Take by mouth.    [provider]  ibuprofen (ADVIL) 600 MG tablet Take 1 tablet (600 mg total) by mouth every 6 (six) hours as needed (pain). 05/07/21   Worthy Rancher, MD  Prenatal Vit-DSS-Fe Cbn-FA (PRENATAL AD PO) Take by mouth.    [provider]  Spacer/Aero-Holding Chambers (AEROCHAMBER MV) inhaler Use as instructed 10/01/18   Lynnell Catalan, MD      Allergies    Vancomycin    Review of Systems   Review of Systems  All other systems reviewed and are negative.   Physical Exam Updated Vital Signs BP 133/88 (BP Location: Right Arm)   Pulse 94   Temp 98.2 F (36.8 C) (Oral)   Resp (!) 24   Ht  (1.549 m)   Wt 119.7 kg   LMP 11/13/2022 (Approximate)   SpO2 100%   BMI 49.88 kg/m  Physical Exam Vitals and nursing note reviewed.  Constitutional:      General: She is not in acute distress.    Appearance: Normal appearance.   HENT:     Mouth/Throat:     Mouth: Mucous membranes are moist.     Pharynx: Oropharynx is clear. Uvula midline. Posterior oropharyngeal erythema present. No uvula swelling.     Tonsils: Tonsillar exudate present. No tonsillar abscesses.     Comments: Uvula midline without swelling. Mild posterior pharyngeal erythema and tonsillar exudate noted. Patent airway. Pt able to speak in clear complete sentences. Tolerating oral secretions. Eyes:     General: No scleral icterus.    Extraocular Movements: Extraocular movements intact.  Cardiovascular:     Rate and Rhythm: Normal rate.  Pulmonary:     Effort: Pulmonary effort is normal. No respiratory distress.  Abdominal:     Palpations: Abdomen is soft. There is no mass.     Tenderness: There is no abdominal tenderness.  Musculoskeletal:        General: Normal range of motion.     Cervical back: Neck supple.  Skin:    General: Skin is warm and dry.     Findings: No rash.  Neurological:     Mental Status: She is alert.     Sensory: Sensation is intact.     Motor: Motor function is intact.  Psychiatric:        Behavior: Behavior normal.     ED Results / Procedures / Treatments  Labs (all labs ordered are listed, but only abnormal results are displayed) Labs Reviewed  GROUP A STREP BY PCR - Abnormal; Notable for the following components:      Result Value   Group A Strep by PCR DETECTED (*)    All other components within normal limits  PREGNANCY, URINE    EKG None  Radiology DG Chest Port 1 View  Result Date: 11/27/2022 CLINICAL DATA:  32 year old female with shortness of breath, throat swelling. EXAM: PORTABLE CHEST 1 VIEW COMPARISON:  Chest radiographs 12/31/2020 and earlier. FINDINGS: Portable AP upright view at 1028 hours. Mildly lordotic positioning. Lung volumes and mediastinal contours remain normal. Allowing for portable technique the lungs are clear. Visualized tracheal air column is within normal limits. No  pneumothorax or pleural effusion. Paucity of bowel gas in the abdomen. No osseous abnormality identified. IMPRESSION: Negative portable chest. Electronically Signed   By: Odessa Fleming M.D.   On: 11/27/2022 10:46    Procedures Procedures    Medications Ordered in ED Medications  dexamethasone (DECADRON) injection 10 mg (10 mg Intramuscular Given 11/27/22 1038)  ibuprofen (ADVIL) tablet 600 mg (600 mg Oral Given 11/27/22 1038)    ED Course/ Medical Decision Making/ A&P Clinical Course as of 11/27/22 1210  Sun Nov 27, 2022  1119 Group A Strep by PCR(!): DETECTED [SB]  1139 Re-evaluated and noted improvement of symptoms with treatment regimen. Discussed discharge treatment plan. Pt agreeable at this time. Pt appears safe for discharge. [SB]    Clinical Course User Index [SB] Celita Aron A, PA-C                             Medical Decision Making Amount and/or Complexity of Data Reviewed Labs: ordered. Decision-making details documented in ED Course. Radiology: ordered.  Risk Prescription drug management.   Patient with sore throat onset 5 days.  Denies sick contacts.  On exam patient with Uvula midline without swelling. Mild posterior pharyngeal erythema and tonsillar exudate noted. Patent airway. Pt able to speak in clear complete sentences. Tolerating oral secretions.  No acute cardiovascular, respiratory exam findings.  Differential diagnosis includes strep pharyngitis, peritonsillar abscess, strep pharyngitis, or Ludwig's angina.   Labs:  I ordered, and personally interpreted labs.  The pertinent results include:   Strep swab positive   Medications:  I ordered medication including decadron, ibuprofen for  symptom management Reevaluation of the patient after these medicines and interventions, I reevaluated the patient and found that they have improved I have reviewed the patients home medicines and have made adjustments as needed   Disposition: Pt presentation suspicious for  strep pharyngitis. Doubt COVID or flu at this time. Patent airway, tolerating secretions, no concern for airway compromise. Less likely Ludwigs angina, no trismus or edema to floor of mouth on exam. Less likely peritonsillar abscess, no fluctuant abscess noted on exam, patent airway, oxygen saturation 100%, water and tolerating secretions.  Doubt concerns at this time for pneumonia.  After consideration of the diagnostic results and the patients response to treatment, I feel that the patient would benefit from Discharge home.  Patient provided with work note.  Prescription for amoxicillin sent to patient's pharmacy.  Strict return precautions discussed including inability to tolerate secretions, fever, inability to open mouth.  Patient acknowledges and verbalizes understanding.  Recommended primary care follow-up.  Patient appears safe for discharge at this time.  Follow-up as indicated in discharge paperwork.  This chart was dictated using  voice recognition software, Nurse, children's. Despite the best efforts of this provider to proofread and correct errors, errors may still occur which can change documentation meaning.    Final Clinical Impression(s) / ED Diagnoses Final diagnoses:  Strep pharyngitis    Rx / DC Orders ED Discharge Orders          Ordered    amoxicillin (AMOXIL) 500 MG capsule  2 times daily        11/27/22 1139              Kalem Rockwell A, PA-C 11/27/22 1211    Gwyneth Sprout, MD 11/27/22 1352

## 2023-04-15 ENCOUNTER — Ambulatory Visit
Admission: EM | Admit: 2023-04-15 | Discharge: 2023-04-15 | Disposition: A | Discharge status: Home / Self Care | Payer: Medicaid Other | Source: Home / Self Care

## 2023-04-15 DIAGNOSIS — K0889 Other specified disorders of teeth and supporting structures: Secondary | ICD-10-CM

## 2023-04-15 MED ORDER — AMOXICILLIN 500 MG PO CAPS
500.0000 mg | ORAL_CAPSULE | Freq: Three times a day (TID) | ORAL | 0 refills | Status: DC
Start: 2023-04-15 — End: 2023-05-29

## 2023-04-15 NOTE — ED Triage Notes (Signed)
"  I think I have a dental abscess on my upper right side in the back". No fever. "Lots of pain with swelling around gum".

## 2023-04-15 NOTE — ED Provider Notes (Signed)
[name]    CSN: [id number] Arrival date & time: 04/15/23  0919      History   Chief Complaint Chief Complaint  Patient presents with   Oral Pain     HPI Olivia Mccall is a 32 y.o. female.   Patient here today for evaluation of possible dental abscess to her upper left molars.  She states that initially she had pain in her left lower dental area about a week ago but has since developed more pain and swelling to her upper molars.  She is planning to call the dentist early next week for appointment.  She denies any fever.  She has not had any nausea or vomiting.  She has taken over-the-counter medication without resolution.  The history is provided by the patient.    Past Medical History:  Diagnosis Date   Diabetes mellitus without complication (HCC)    gestational   Medical history non-contributory     Patient Active Problem List   Diagnosis Date Noted   Obesity 09/05/2022   Moderate episode of recurrent major depressive disorder (HCC) 09/06/2021   Recurrent major depressive disorder, in partial remission (HCC) 09/06/2021   Generalized anxiety disorder 07/05/2021   History of postpartum depression 07/05/2021   Anxiety 07/05/2021   Postpartum depression 07/05/2021   Vaginal delivery 05/06/2021   DM (diabetes mellitus), gestational 05/06/2021   History of gestational diabetes 05/06/2021   PROM with onset of labor more than 24 hours following rupture 05/04/2021   Acute respiratory failure with hypoxia (HCC) 09/27/2018   History of acute respiratory failure 09/27/2018    Past Surgical History:  Procedure Laterality Date   DILATION AND CURETTAGE OF UTERUS      OB History     Gravida  5   Para  3   Term  2   Preterm  1   AB  2   Living  3      SAB  1   IAB  1   Ectopic      Multiple  0   Live Births  3            Home Medications    Prior to Admission medications   Medication Sig Start Date End Date Taking?  Authorizing Provider  amoxicillin (AMOXIL) 500 MG capsule Take 1 capsule (500 mg total) by mouth 3 (three) times daily. 04/15/23  Yes Tomi Bamberger, PA-C  Naltrexone-buPROPion HCl ER (CONTRAVE) 8-90 MG TB12 Take by mouth. 03/16/22  Yes [provider]  acetaminophen (TYLENOL) 500 MG tablet Take 2 tablets (1,000 mg total) by mouth every 8 (eight) hours as needed. 05/07/21   Worthy Rancher, MD  ferrous sulfate 325 (65 FE) MG tablet Take by mouth.    [provider]  ibuprofen (ADVIL) 600 MG tablet Take 1 tablet (600 mg total) by mouth every 6 (six) hours as needed (pain). 05/07/21   Worthy Rancher, MD  Prenatal Vit-DSS-Fe Cbn-FA (PRENATAL AD PO) Take by mouth.    [provider]  Spacer/Aero-Holding Chambers (AEROCHAMBER MV) inhaler Use as instructed 10/01/18   Lynnell Catalan, MD    Family History Family History  Problem Relation Age of Onset   Diabetes Mellitus II Maternal Grandmother    Hypertension Maternal Grandfather    Diabetes Maternal Grandfather     Social History Social History   Tobacco Use   Smoking status: Never   Smokeless tobacco: Never  Vaping Use   Vaping status: Never Used  Substance Use Topics   Alcohol use: No    Comment: socially   Drug use: No     Allergies   Vancomycin   Review of Systems Review of Systems  Constitutional:  Negative for chills and fever.  HENT:  Positive for dental problem.   Eyes:  Negative for discharge and redness.  Respiratory:  Negative for shortness of breath.   Gastrointestinal:  Negative for abdominal pain, nausea and vomiting.     Physical Exam Triage Vital Signs ED Triage Vitals  Encounter Vitals Group     BP 04/15/23 0947 122/77     Systolic BP Percentile --      Diastolic BP Percentile --      Pulse Rate 04/15/23 0947 76     Resp 04/15/23 0947 18     Temp 04/15/23 0947 99.1 F (37.3 C)     Temp Source 04/15/23 0947 Oral     SpO2 04/15/23 0947 97 %     Weight 04/15/23 0946  264 lb (119.7 kg)     Height 04/15/23 0946 5\' 1"  (1.549 m)     Head Circumference --      Peak Flow --      Pain Score 04/15/23 0943 5     Pain Loc --      Pain Education --      Exclude from Growth Chart --    No data found.  Updated Vital Signs BP 122/77 (BP Location: Left Arm)   Pulse 76   Temp 99.1 F (37.3 C) (Oral)   Resp 18   Ht 5\' 1"  (1.549 m)   Wt 264 lb (119.7 kg)   LMP 04/11/2023 (Exact Date)   SpO2 97%   BMI 49.88 kg/m      Physical Exam Vitals and nursing note reviewed.  Constitutional:      General: She is not in acute distress.    Appearance: Normal appearance. She is not ill-appearing.  HENT:     Head: Normocephalic and atraumatic.     Nose: Nose normal. No congestion or rhinorrhea.     Mouth/Throat:     Comments: Left upper molars with diffuse gingival swelling and erythema Eyes:     Conjunctiva/sclera: Conjunctivae normal.  Cardiovascular:     Rate and Rhythm: Normal rate.  Pulmonary:     Effort: Pulmonary effort is normal.  Neurological:     Mental Status: She is alert.  Psychiatric:        Mood and Affect: Mood normal.        Behavior: Behavior normal.        Thought Content: Thought content normal.      UC Treatments / Results  Labs (all labs ordered are listed, but only abnormal results are displayed) Labs Reviewed - No data to display  EKG   Radiology No results found.  Procedures Procedures (including critical care time)  Medications Ordered in UC Medications - No data to display  Initial Impression / Assessment and Plan / UC Course  I have reviewed the triage vital signs and the nursing notes.  Pertinent labs & imaging results that were available during my care of the patient were reviewed by me and considered in my medical decision making (see chart for details).    Will treat to cover abscess with amoxicillin.  Advised follow-up with dentist as soon as possible.  Recommended sooner follow-up in the emergency room  with any worsening symptoms while awaiting dentist appointment.  Final Clinical Impressions(s) /  UC Diagnoses   Final diagnoses:  Pain, dental   Discharge Instructions   None    ED Prescriptions     Medication Sig Dispense Auth. Provider   amoxicillin (AMOXIL) 500 MG capsule Take 1 capsule (500 mg total) by mouth 3 (three) times daily. 21 capsule Tomi Bamberger, PA-C      PDMP not reviewed this encounter.   Tomi Bamberger, PA-C 04/15/23 1030

## 2023-05-29 ENCOUNTER — Ambulatory Visit
Admission: RE | Admit: 2023-05-29 | Discharge: 2023-05-29 | Disposition: A | Discharge status: Home / Self Care | Payer: Medicaid Other | Source: Ambulatory Visit | Attending: Internal Medicine | Admitting: Internal Medicine

## 2023-05-29 VITALS — BP 120/81 | HR 81 | Temp 98.6°F | Resp 18

## 2023-05-29 DIAGNOSIS — M545 Low back pain, unspecified: Secondary | ICD-10-CM

## 2023-05-29 DIAGNOSIS — K047 Periapical abscess without sinus: Secondary | ICD-10-CM

## 2023-05-29 MED ORDER — AMOXICILLIN-POT CLAVULANATE 875-125 MG PO TABS
1.0000 | ORAL_TABLET | Freq: Two times a day (BID) | ORAL | 0 refills | Status: AC
Start: 2023-05-29 — End: ?

## 2023-05-29 MED ORDER — CYCLOBENZAPRINE HCL 5 MG PO TABS
5.0000 mg | ORAL_TABLET | Freq: Two times a day (BID) | ORAL | 0 refills | Status: AC | PRN
Start: 2023-05-29 — End: ?

## 2023-05-29 NOTE — ED Triage Notes (Addendum)
C/O dental abscess to left upper tooth onset approx 5 days ago. C/O intermittent right low back pain "for months"; reports flaring up 4 days ago "like I pulled something". C/O pain worse at night when laying down. Denies any injury or strenous activity. Denies parasthesias. Has been using heat pad and taking Aleve with some relief.

## 2023-05-29 NOTE — Discharge Instructions (Addendum)
I have prescribed you an antibiotic for dental infection.  Follow-up with dentist for further evaluation and management of this.  Suspect you have a muscle strain of your lower back so I have prescribed a muscle relaxer.  Please be advised that it can make you drowsy so no driving or drinking alcohol with it.  Follow-up with orthopedic and sports medicine doctor if back pain persists or worsens given it has been present for multiple months.

## 2023-05-29 NOTE — ED Provider Notes (Signed)
[name]    CSN: [id number] Arrival date & time: 05/29/23  1126      History   Chief Complaint Chief Complaint  Patient presents with   Back Pain   Appt 1130   Dental Problem    HPI Olivia Mccall is a 32 y.o. female.   Patient presents with 2 different chief complaints today.  Patient reports concern for abscess in left upper dentition.  States this started a few days ago but is not painful.  Denies any drainage from the area.  Denies any fever.  She has similar abscess in August with successful treatment with antibiotics.  States that she does not have dental insurance so she was unable to see the dentist for this.  Patient also reporting right lower back pain.  States that it has been intermittent for the past 4 months but flared up over the past few days.  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  She has taken Aleve at approximately 3 AM this morning with minimal improvement.  Denies dysuria, urinary frequency, urinary or bowel incontinence, saddle anesthesia.   Back Pain   Past Medical History:  Diagnosis Date   Diabetes mellitus without complication [facility])    gestational    Patient Active Problem List   Diagnosis Date Noted   Obesity 09/05/2022   Moderate episode of recurrent major depressive disorder (HCC) 09/06/2021   Recurrent major depressive disorder, in partial remission (HCC) 09/06/2021   Generalized anxiety disorder 07/05/2021   History of postpartum depression 07/05/2021   Anxiety 07/05/2021   Postpartum depression 07/05/2021   Vaginal delivery 05/06/2021   DM (diabetes mellitus), gestational 05/06/2021   History of gestational diabetes 05/06/2021   PROM with onset of labor more than 24 hours following rupture 05/04/2021   Acute respiratory failure with hypoxia (HCC) 09/27/2018   History of acute respiratory failure 09/27/2018    Past Surgical History:   Procedure Laterality Date   DILATION AND CURETTAGE OF UTERUS      OB History     Gravida  5   Para  3   Term  2   Preterm  1   AB  2   Living  3      SAB  1   IAB  1   Ectopic      Multiple  0   Live Births  3            Home Medications    Prior to Admission medications   Medication Sig Start Date End Date Taking? Authorizing Provider  amoxicillin-clavulanate (AUGMENTIN) 875-125 MG tablet Take 1 tablet by mouth every 12 (twelve) hours. 05/29/23  Yes , Acie Fredrickson, FNP  cyclobenzaprine (FLEXERIL) 5 MG tablet Take 1 tablet (5 mg total) by mouth 2 (two) times daily as needed for muscle spasms. 05/29/23  Yes , Rolly Salter E, FNP  acetaminophen (TYLENOL) 500 MG tablet Take 2 tablets (1,000 mg total) by mouth every 8 (eight) hours as needed. 05/07/21   Worthy Rancher, MD  ferrous sulfate 325 (65 FE) MG tablet Take by mouth.    [provider]  ibuprofen (ADVIL) 600 MG tablet Take 1 tablet (600 mg total) by mouth every 6 (six) hours as needed (pain). 05/07/21   Worthy Rancher, MD  Naltrexone-buPROPion HCl ER (CONTRAVE) 8-90 MG TB12 Take by mouth. 03/16/22   [provider]  Prenatal Vit-DSS-Fe  Cbn-FA (PRENATAL AD PO) Take by mouth.    [provider]  Spacer/Aero-Holding Chambers (AEROCHAMBER MV) inhaler Use as instructed 10/01/18   Lynnell Catalan, MD    Family History Family History  Problem Relation Age of Onset   Diabetes Mellitus II Maternal Grandmother    Hypertension Maternal Grandfather    Diabetes Maternal Grandfather     Social History Social History   Tobacco Use   Smoking status: Never   Smokeless tobacco: Never  Vaping Use   Vaping status: Never Used  Substance Use Topics   Alcohol use: No    Comment: occasionally   Drug use: No     Allergies   Vancomycin   Review of Systems Review of Systems Per HPI  Physical Exam Triage Vital Signs ED Triage Vitals [05/29/23 1145]  Encounter Vitals Group      BP 120/81     Systolic BP Percentile      Diastolic BP Percentile      Pulse Rate 81     Resp 18     Temp 98.6 F (37 C)     Temp Source Oral     SpO2 97 %     Weight      Height      Head Circumference      Peak Flow      Pain Score 6     Pain Loc      Pain Education      Exclude from Growth Chart    No data found.  Updated Vital Signs BP 120/81   Pulse 81   Temp 98.6 F (37 C) (Oral)   Resp 18   LMP 05/09/2023 (Exact Date)   SpO2 97%   Breastfeeding No   Visual Acuity Right Eye Distance:   Left Eye Distance:   Bilateral Distance:    Right Eye Near:   Left Eye Near:    Bilateral Near:     Physical Exam Constitutional:      General: She is not in acute distress.    Appearance: Normal appearance. She is not toxic-appearing or diaphoretic.  HENT:     Head: Normocephalic and atraumatic.     Mouth/Throat:      Comments: Patient has approximately 0.5 cm abscess-like lesion present to left upper dentition on the outer portion.  No drainage noted. Eyes:     Extraocular Movements: Extraocular movements intact.     Conjunctiva/sclera: Conjunctivae normal.  Pulmonary:     Effort: Pulmonary effort is normal.  Genitourinary:    Comments: Tenderness to palpation to right lower lumbar region.  No direct spinal tenderness, crepitus, step-off noted. Neurological:     General: No focal deficit present.     Mental Status: She is alert and oriented to person, place, and time. Mental status is at baseline.     Deep Tendon Reflexes: Reflexes are normal and symmetric.  Psychiatric:        Mood and Affect: Mood normal.        Behavior: Behavior normal.        Thought Content: Thought content normal.        Judgment: Judgment normal.      UC Treatments / Results  Labs (all labs ordered are listed, but only abnormal results are displayed) Labs Reviewed - No data to display  EKG   Radiology No results found.  Procedures Procedures (including critical care  time)  Medications Ordered in UC Medications - No data to display  Initial  Impression / Assessment and Plan / UC Course  I have reviewed the triage vital signs and the nursing notes.  Pertinent labs & imaging results that were available during my care of the patient were reviewed by me and considered in my medical decision making (see chart for details).     1.  Dental abscess  Will treat with Augmentin antibiotic.  It is very small so no need for emergent evaluation.  Patient provided dental resources paperwork for follow-up with dentist.  2.  Right lower back pain  Suspect muscle strain.  Given no injury or direct spinal tenderness, imaging was deferred.  Will treat with muscle relaxer.  Patient advised that muscle relaxer can make her drowsy and do not drive or drink alcohol with taking it.  Advised supportive care.  Patient offered IM Toradol but declined this.  Advised her to follow-up with orthopedic and sports medicine doctor if pain persist or worsens.  Patient verbalized understanding and was agreeable with plan. Final Clinical Impressions(s) / UC Diagnoses   Final diagnoses:  Dental abscess  Acute right-sided low back pain without sciatica     Discharge Instructions      I have prescribed you an antibiotic for dental infection.  Follow-up with dentist for further evaluation and management of this.  Suspect you have a muscle strain of your lower back so I have prescribed a muscle relaxer.  Please be advised that it can make you drowsy so no driving or drinking alcohol with it.  Follow-up with orthopedic and sports medicine doctor if back pain persists or worsens given it has been present for multiple months.    ED Prescriptions     Medication Sig Dispense Auth. Provider   amoxicillin-clavulanate (AUGMENTIN) 875-125 MG tablet Take 1 tablet by mouth every 12 (twelve) hours. 14 tablet [place], [place], [place]   cyclobenzaprine (FLEXERIL) 5 MG tablet Take 1 tablet (5 mg total)  by mouth 2 (two) times daily as needed for muscle spasms. 20 tablet [place], Acie Fredrickson, [place]      PDMP not reviewed this encounter.   Gustavus Bryant, [place] 05/29/23 1315

## 2023-09-13 ENCOUNTER — Other Ambulatory Visit: Payer: Self-pay

## 2023-09-13 ENCOUNTER — Emergency Department (HOSPITAL_BASED_OUTPATIENT_CLINIC_OR_DEPARTMENT_OTHER)
Admission: EM | Admit: 2023-09-13 | Discharge: 2023-09-13 | Disposition: A | Discharge status: Home / Self Care | Payer: Medicaid Other | Attending: Emergency Medicine | Admitting: Emergency Medicine

## 2023-09-13 ENCOUNTER — Encounter (HOSPITAL_BASED_OUTPATIENT_CLINIC_OR_DEPARTMENT_OTHER): Payer: Self-pay | Admitting: Emergency Medicine

## 2023-09-13 ENCOUNTER — Emergency Department (HOSPITAL_BASED_OUTPATIENT_CLINIC_OR_DEPARTMENT_OTHER): Payer: Medicaid Other | Admitting: Radiology

## 2023-09-13 DIAGNOSIS — R059 Cough, unspecified: Secondary | ICD-10-CM | POA: Diagnosis present

## 2023-09-13 DIAGNOSIS — R0781 Pleurodynia: Secondary | ICD-10-CM

## 2023-09-13 DIAGNOSIS — J101 Influenza due to other identified influenza virus with other respiratory manifestations: Secondary | ICD-10-CM

## 2023-09-13 DIAGNOSIS — J09X2 Influenza due to identified novel influenza A virus with other respiratory manifestations: Secondary | ICD-10-CM | POA: Diagnosis not present

## 2023-09-13 DIAGNOSIS — Z20822 Contact with and (suspected) exposure to covid-19: Secondary | ICD-10-CM | POA: Diagnosis not present

## 2023-09-13 LAB — CBC
HCT: 41.5 % (ref 36.0–46.0)
Hemoglobin: 13.4 g/dL (ref 12.0–15.0)
MCH: 25.5 pg — ABNORMAL LOW (ref 26.0–34.0)
MCHC: 32.3 g/dL (ref 30.0–36.0)
MCV: 79 fL — ABNORMAL LOW (ref 80.0–100.0)
Platelets: 207 10*3/uL (ref 150–400)
RBC: 5.25 MIL/uL — ABNORMAL HIGH (ref 3.87–5.11)
RDW: 13.5 % (ref 11.5–15.5)
WBC: 5 10*3/uL (ref 4.0–10.5)
nRBC: 0 % (ref 0.0–0.2)

## 2023-09-13 LAB — RESP PANEL BY RT-PCR (RSV, FLU A&B, COVID)  RVPGX2
Influenza A by PCR: POSITIVE — AB
Influenza B by PCR: NEGATIVE
Resp Syncytial Virus by PCR: NEGATIVE
SARS Coronavirus 2 by RT PCR: NEGATIVE

## 2023-09-13 LAB — PREGNANCY, URINE: Preg Test, Ur: NEGATIVE

## 2023-09-13 LAB — BASIC METABOLIC PANEL
Anion gap: 8 (ref 5–15)
BUN: 8 mg/dL (ref 6–20)
CO2: 26 mmol/L (ref 22–32)
Calcium: 9.7 mg/dL (ref 8.9–10.3)
Chloride: 103 mmol/L (ref 98–111)
Creatinine, Ser: 0.94 mg/dL (ref 0.44–1.00)
GFR, Estimated: >60 mL/min (ref >60)
Glucose, Bld: 103 mg/dL — ABNORMAL HIGH (ref 70–99)
Potassium: 3.5 mmol/L (ref 3.5–5.1)
Sodium: 137 mmol/L (ref 135–145)

## 2023-09-13 LAB — TROPONIN I (HIGH SENSITIVITY): Troponin I (High Sensitivity): <2 ng/L (ref <18)

## 2023-09-13 MED ORDER — ONDANSETRON 4 MG PO TBDP: 4.0000 mg | ORAL_TABLET | Freq: Three times a day (TID) | ORAL | 0 refills | Status: AC | PRN

## 2023-09-13 MED ORDER — KETOROLAC TROMETHAMINE 15 MG/ML IJ SOLN
15.0000 mg | Freq: Once | INTRAMUSCULAR | Status: AC
  Administered 2023-09-13: 15 mg via INTRAMUSCULAR
  Filled 2023-09-13: qty 1

## 2023-09-13 MED ORDER — ACETAMINOPHEN 500 MG PO TABS
1000.0000 mg | ORAL_TABLET | Freq: Once | ORAL | Status: AC
  Administered 2023-09-13: 1000 mg via ORAL
  Filled 2023-09-13: qty 2

## 2023-09-13 MED ORDER — OSELTAMIVIR PHOSPHATE 75 MG PO CAPS: 75.0000 mg | ORAL_CAPSULE | Freq: Two times a day (BID) | ORAL | 0 refills | Status: AC

## 2023-09-13 NOTE — Discharge Instructions (Addendum)
You were seen for your influenza in the emergency department.   At home, please take Tylenol and ibuprofen for your pain.  Take the Tamiflu for your influenza as well.  You have been prescribed Zofran if you develop nausea or vomiting.  Check your MyChart online for the results of any tests that had not resulted by the time you left the emergency department.   Follow-up with your primary doctor in 2-3 days regarding your visit this can be over the phone.  Return immediately to the emergency department if you experience any of the following: Difficulty breathing, or any other concerning symptoms.    Thank you for visiting our Emergency Department. It was a pleasure taking care of you today.

## 2023-09-13 NOTE — ED Provider Notes (Signed)
[facility] AT [facility] Provider Note   CSN: [id number] Arrival date & time: 09/13/23  8657     History  Chief Complaint  Patient presents with   Chest Pain   Cough    Olivia Mccall is a 33 y.o. female.  33 year old female who presents to the emergency department with cough and chest pain.  Patient reports that last night she started feeling sick.  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  Is having a fever as well as a productive cough.  Says that she has substernal chest pain when she coughs and when she breathes deeply.  No history of DVT or PE.  Not on blood thinners.  No recent surgery.       Home Medications Prior to Admission medications   Medication Sig Start Date End Date Taking? Authorizing Provider  ondansetron (ZOFRAN-ODT) 4 MG disintegrating tablet Take 1 tablet (4 mg total) by mouth every 8 (eight) hours as needed for nausea or vomiting. 09/13/23  Yes Rondel Baton, MD  oseltamivir (TAMIFLU) 75 MG capsule Take 1 capsule (75 mg total) by mouth every 12 (twelve) hours. 09/13/23  Yes Rondel Baton, MD  acetaminophen (TYLENOL) 500 MG tablet Take 2 tablets (1,000 mg total) by mouth every 8 (eight) hours as needed. 05/07/21   Worthy Rancher, MD  amoxicillin-clavulanate (AUGMENTIN) 875-125 MG tablet Take 1 tablet by mouth every 12 (twelve) hours. 05/29/23   Gustavus Bryant, FNP  cyclobenzaprine (FLEXERIL) 5 MG tablet Take 1 tablet (5 mg total) by mouth 2 (two) times daily as needed for muscle spasms. 05/29/23   Gustavus Bryant, FNP  ferrous sulfate 325 (65 FE) MG tablet Take by mouth.    [provider]  ibuprofen (ADVIL) 600 MG tablet Take 1 tablet (600 mg total) by mouth every 6 (six) hours as needed (pain). 05/07/21   Worthy Rancher, MD  Naltrexone-buPROPion HCl ER (CONTRAVE) 8-90 MG TB12 Take by mouth. 03/16/22   [provider]  Prenatal Vit-DSS-Fe Cbn-FA (PRENATAL AD PO)  Take by mouth.    [provider]  Spacer/Aero-Holding Chambers (AEROCHAMBER MV) inhaler Use as instructed 10/01/18   Lynnell Catalan, MD      Allergies    Vancomycin    Review of Systems   Review of Systems  Physical Exam Updated Vital Signs BP 122/79   Pulse 98   Temp 100 F (37.8 C) (Oral)   Resp 18   Ht 5\' 1"  (1.549 m)   Wt 119.7 kg   LMP 09/04/2023 (Approximate)   SpO2 99%   BMI 49.86 kg/m  Physical Exam Vitals and nursing note reviewed.  Constitutional:      General: She is not in acute distress.    Appearance: She is well-developed.  HENT:     Head: Normocephalic and atraumatic.     Right Ear: External ear normal.     Left Ear: External ear normal.     Nose: Nose normal.  Eyes:     Extraocular Movements: Extraocular movements intact.     Conjunctiva/sclera: Conjunctivae normal.     Pupils: Pupils are equal, round, and reactive to light.  Cardiovascular:     Rate and Rhythm: Normal rate and regular rhythm.     Heart sounds: No murmur heard.    Comments: Chest pain not reproducible Pulmonary:     Effort: Pulmonary effort is normal. No respiratory distress.     Breath  sounds: Normal breath sounds.  Musculoskeletal:     Cervical back: Normal range of motion and neck supple.     Right lower leg: No edema.     Left lower leg: No edema.  Skin:    General: Skin is warm and dry.  Neurological:     Mental Status: She is alert and oriented to person, place, and time. Mental status is at baseline.  Psychiatric:        Mood and Affect: Mood normal.     ED Results / Procedures / Treatments   Labs (all labs ordered are listed, but only abnormal results are displayed) Labs Reviewed  RESP PANEL BY RT-PCR (RSV, FLU A&B, COVID)  RVPGX2 - Abnormal; Notable for the following components:      Result Value   Influenza A by PCR POSITIVE (*)    All other components within normal limits  BASIC METABOLIC PANEL - Abnormal; Notable for the following components:    Glucose, Bld 103 (*)    All other components within normal limits  CBC - Abnormal; Notable for the following components:   RBC 5.25 (*)    MCV 79.0 (*)    MCH 25.5 (*)    All other components within normal limits  PREGNANCY, URINE  TROPONIN I (HIGH SENSITIVITY)    EKG EKG Interpretation Date/Time:  Wednesday September 13 2023 19:09:19 EST Ventricular Rate:  119 PR Interval:  134 QRS Duration:  72 QT Interval:  302 QTC Calculation: 424 R Axis:   70  Text Interpretation: Sinus tachycardia Right atrial enlargement Cannot rule out Inferior infarct , age undetermined Abnormal ECG Confirmed by Vonita Moss [phone]) on 09/13/2023 7:33:40 PM  Radiology DG Chest 2 View Result Date: 09/13/2023 CLINICAL DATA:  Cough and chest pain EXAM: CHEST - 2 VIEW COMPARISON:  11/27/2022 FINDINGS: The heart size and mediastinal contours are within normal limits. Both lungs are clear. The visualized skeletal structures are unremarkable. IMPRESSION: No active cardiopulmonary disease. Electronically Signed   By: Alcide Clever M.D.   On: 09/13/2023 20:43    Procedures Procedures    Medications Ordered in ED Medications  acetaminophen (TYLENOL) tablet 1,000 mg (1,000 mg Oral Given 09/13/23 2314)  ketorolac (TORADOL) 15 MG/ML injection 15 mg (15 mg Intramuscular Given 09/13/23 2314)    ED Course/ Medical Decision Making/ A&P                                 Medical Decision Making Amount and/or Complexity of Data Reviewed Labs: ordered. Radiology: ordered.  Risk OTC drugs. Prescription drug management.   Olivia Mccall is a 33 y.o. female who presents to the emergency department with URI symptoms and chest pain  Initial Ddx:  URI, pleurisy, pneumonia, PE  MDM:  Feel the patient likely has a URI based on their symptoms.  Overall well-appearing on exam.  Is complaining of some chest pain with coughing and deep breathing.  Suspect this is pleurisy from infection or musculoskeletal pain from  coughing.  Did consider PE but with URI as of probable cause and without any history of DVT or PE or signs of DVT or other risk factors at this time we will hold off on PE testing at the moment.  Will obtain a chest x-ray to make sure there is no infiltrate.  Will also obtain COVID and flu.   Plan:  COVID/flu Chest x-ray  ED Summary/Re-evaluation:  Patient had labs  sent from triage which showed an undetectably low troponin.  EKG without STEMI or other acute concerning changes.  She was found to be influenza positive.  Chest x-ray without infiltrate.  Was feeling better after Toradol and Tylenol.  Patient reevaluated in the emergency department and was stable.  Satting well on room air.  Will have her follow-up with her primary doctor in several days.  This patient presents to the ED for concern of complaints listed in HPI, this involves an extensive number of treatment options, and is a complaint that carries with it a high risk of complications and morbidity. Disposition including potential need for admission considered.   Dispo: DC Home. Return precautions discussed including, but not limited to, those listed in the AVS. Allowed pt time to ask questions which were answered fully prior to dc.  Records reviewed Outpatient Clinic Notes The following labs were independently interpreted: Chemistry and show no acute abnormality I independently reviewed the following imaging with scope of interpretation limited to determining acute life threatening conditions related to emergency care: Chest x-ray and agree with the radiologist interpretation with the following exceptions: none I personally reviewed and interpreted cardiac monitoring: normal sinus rhythm  I personally reviewed and interpreted the pt's EKG: see above for interpretation  I have reviewed the patients home medications and made adjustments as needed  Portions of this note were generated with Dragon dictation software. Dictation errors may  occur despite best attempts at proofreading.     Final Clinical Impression(s) / ED Diagnoses Final diagnoses:  Influenza A  Pleuritic chest pain    Rx / DC Orders ED Discharge Orders          Ordered    oseltamivir (TAMIFLU) 75 MG capsule  Every 12 hours        09/13/23 2256    ondansetron (ZOFRAN-ODT) 4 MG disintegrating tablet  Every 8 hours PRN        09/13/23 2314              Rondel Baton, MD 09/15/23 1020

## 2023-09-13 NOTE — ED Triage Notes (Signed)
Pt via pov from home with cough and chest pain upon inspiration and coughing. Pt denies fever; hx of pneumonia. Pt alert & oriented, nad noted.
